# Patient Record
Sex: Male | Born: 1960 | Race: White | Hispanic: No | Marital: Married | Smoking: Current every day smoker
Health system: Southern US, Community
[De-identification: ages and names within clinical notes are randomized; demographics above are authoritative.]

## PROBLEM LIST (undated history)

## (undated) DIAGNOSIS — S129XXA Fracture of neck, unspecified, initial encounter: Secondary | ICD-10-CM

## (undated) DIAGNOSIS — I2510 Atherosclerotic heart disease of native coronary artery without angina pectoris: Secondary | ICD-10-CM

## (undated) DIAGNOSIS — R569 Unspecified convulsions: Secondary | ICD-10-CM

## (undated) DIAGNOSIS — K5792 Diverticulitis of intestine, part unspecified, without perforation or abscess without bleeding: Secondary | ICD-10-CM

## (undated) HISTORY — PX: FRACTURE SURGERY: SHX138

## (undated) HISTORY — PX: OTHER SURGICAL HISTORY: SHX169

## (undated) HISTORY — DX: Atherosclerotic heart disease of native coronary artery without angina pectoris: I25.10

---

## 1998-11-20 ENCOUNTER — Emergency Department (HOSPITAL_COMMUNITY): Admission: EM | Admit: 1998-11-20 | Discharge: 1998-11-20 | Payer: Self-pay | Admitting: Emergency Medicine

## 2010-01-13 ENCOUNTER — Emergency Department (HOSPITAL_COMMUNITY): Admission: EM | Admit: 2010-01-13 | Discharge: 2010-01-13 | Payer: Self-pay | Admitting: Emergency Medicine

## 2010-02-12 ENCOUNTER — Ambulatory Visit (HOSPITAL_COMMUNITY): Admission: RE | Admit: 2010-02-12 | Discharge: 2010-02-12 | Payer: Self-pay | Admitting: Family Medicine

## 2010-03-01 ENCOUNTER — Ambulatory Visit (HOSPITAL_COMMUNITY): Admission: RE | Admit: 2010-03-01 | Payer: Self-pay | Admitting: Family Medicine

## 2010-04-21 ENCOUNTER — Encounter: Payer: Self-pay | Admitting: Family Medicine

## 2010-06-12 LAB — COMPREHENSIVE METABOLIC PANEL
AST: 26 U/L (ref 0–37)
Albumin: 4.1 g/dL (ref 3.5–5.2)
Alkaline Phosphatase: 61 U/L (ref 39–117)
BUN: 8 mg/dL (ref 6–23)
Chloride: 108 mEq/L (ref 96–112)
Creatinine, Ser: 0.93 mg/dL (ref 0.4–1.5)
GFR calc Af Amer: >60 mL/min (ref >60)
Potassium: 3.9 mEq/L (ref 3.5–5.1)
Total Bilirubin: 0.7 mg/dL (ref 0.3–1.2)
Total Protein: 6.6 g/dL (ref 6.0–8.3)

## 2010-06-12 LAB — CBC
HCT: 47 % (ref 39.0–52.0)
Hemoglobin: 16.4 g/dL (ref 13.0–17.0)
MCV: 97.1 fL (ref 78.0–100.0)
RBC: 4.84 MIL/uL (ref 4.22–5.81)
WBC: 4.9 10*3/uL (ref 4.0–10.5)

## 2010-06-12 LAB — GLUCOSE, CAPILLARY: Glucose-Capillary: 106 mg/dL — ABNORMAL HIGH (ref 70–99)

## 2010-06-12 LAB — DIFFERENTIAL
Basophils Absolute: 0 10*3/uL (ref 0.0–0.1)
Eosinophils Relative: 3 % (ref 0–5)
Lymphocytes Relative: 28 % (ref 12–46)
Monocytes Absolute: 0.3 10*3/uL (ref 0.1–1.0)
Monocytes Relative: 5 % (ref 3–12)
Neutro Abs: 3.1 10*3/uL (ref 1.7–7.7)

## 2010-06-12 LAB — CK TOTAL AND CKMB (NOT AT ARMC)
CK, MB: 2.9 ng/mL (ref 0.3–4.0)
Relative Index: 1.8 (ref 0.0–2.5)

## 2010-06-12 LAB — ETHANOL: Alcohol, Ethyl (B): <5 mg/dL (ref 0–10)

## 2010-06-12 LAB — RAPID URINE DRUG SCREEN, HOSP PERFORMED: Barbiturates: NOT DETECTED

## 2010-06-18 ENCOUNTER — Other Ambulatory Visit (HOSPITAL_COMMUNITY): Payer: Self-pay | Admitting: Family Medicine

## 2010-06-18 DIAGNOSIS — M542 Cervicalgia: Secondary | ICD-10-CM

## 2010-06-24 ENCOUNTER — Ambulatory Visit (HOSPITAL_COMMUNITY)
Admission: RE | Admit: 2010-06-24 | Discharge: 2010-06-24 | Disposition: A | Discharge status: Home / Self Care | Payer: BC Managed Care – PPO | Source: Ambulatory Visit | Attending: Family Medicine | Admitting: Family Medicine

## 2010-06-24 DIAGNOSIS — M542 Cervicalgia: Secondary | ICD-10-CM | POA: Insufficient documentation

## 2010-06-24 DIAGNOSIS — M79609 Pain in unspecified limb: Secondary | ICD-10-CM | POA: Insufficient documentation

## 2010-06-24 DIAGNOSIS — M5020 Other cervical disc displacement, unspecified cervical region: Secondary | ICD-10-CM | POA: Insufficient documentation

## 2010-06-27 ENCOUNTER — Other Ambulatory Visit: Payer: Self-pay | Admitting: Family Medicine

## 2010-06-27 DIAGNOSIS — M5000 Cervical disc disorder with myelopathy, unspecified cervical region: Secondary | ICD-10-CM

## 2010-06-28 ENCOUNTER — Ambulatory Visit
Admission: RE | Admit: 2010-06-28 | Discharge: 2010-06-28 | Disposition: A | Discharge status: Home / Self Care | Payer: BC Managed Care – PPO | Source: Ambulatory Visit | Attending: Family Medicine | Admitting: Family Medicine

## 2010-06-28 DIAGNOSIS — M5000 Cervical disc disorder with myelopathy, unspecified cervical region: Secondary | ICD-10-CM

## 2010-10-21 ENCOUNTER — Other Ambulatory Visit (HOSPITAL_COMMUNITY): Payer: Self-pay | Admitting: Family Medicine

## 2010-10-21 ENCOUNTER — Ambulatory Visit (HOSPITAL_COMMUNITY)
Admission: RE | Admit: 2010-10-21 | Discharge: 2010-10-21 | Disposition: A | Discharge status: Home / Self Care | Payer: BC Managed Care – PPO | Source: Ambulatory Visit | Attending: Family Medicine | Admitting: Family Medicine

## 2010-10-21 DIAGNOSIS — M25471 Effusion, right ankle: Secondary | ICD-10-CM

## 2010-10-21 DIAGNOSIS — M25571 Pain in right ankle and joints of right foot: Secondary | ICD-10-CM

## 2010-10-21 DIAGNOSIS — W19XXXA Unspecified fall, initial encounter: Secondary | ICD-10-CM | POA: Insufficient documentation

## 2010-10-21 DIAGNOSIS — M25579 Pain in unspecified ankle and joints of unspecified foot: Secondary | ICD-10-CM | POA: Insufficient documentation

## 2010-10-21 DIAGNOSIS — S8990XA Unspecified injury of unspecified lower leg, initial encounter: Secondary | ICD-10-CM | POA: Insufficient documentation

## 2010-10-21 DIAGNOSIS — M25476 Effusion, unspecified foot: Secondary | ICD-10-CM | POA: Insufficient documentation

## 2010-10-21 DIAGNOSIS — M25473 Effusion, unspecified ankle: Secondary | ICD-10-CM | POA: Insufficient documentation

## 2013-10-15 ENCOUNTER — Encounter (HOSPITAL_COMMUNITY): Payer: Self-pay | Admitting: Emergency Medicine

## 2013-10-15 ENCOUNTER — Emergency Department (HOSPITAL_COMMUNITY)
Admission: EM | Admit: 2013-10-15 | Discharge: 2013-10-15 | Disposition: A | Discharge status: Home / Self Care | Payer: BC Managed Care – PPO | Attending: Emergency Medicine | Admitting: Emergency Medicine

## 2013-10-15 DIAGNOSIS — R42 Dizziness and giddiness: Secondary | ICD-10-CM | POA: Insufficient documentation

## 2013-10-15 DIAGNOSIS — IMO0002 Reserved for concepts with insufficient information to code with codable children: Secondary | ICD-10-CM | POA: Insufficient documentation

## 2013-10-15 DIAGNOSIS — R21 Rash and other nonspecific skin eruption: Secondary | ICD-10-CM | POA: Insufficient documentation

## 2013-10-15 DIAGNOSIS — F17200 Nicotine dependence, unspecified, uncomplicated: Secondary | ICD-10-CM | POA: Diagnosis not present

## 2013-10-15 DIAGNOSIS — T498X5A Adverse effect of other topical agents, initial encounter: Secondary | ICD-10-CM | POA: Insufficient documentation

## 2013-10-15 DIAGNOSIS — T7840XA Allergy, unspecified, initial encounter: Secondary | ICD-10-CM

## 2013-10-15 DIAGNOSIS — Z8669 Personal history of other diseases of the nervous system and sense organs: Secondary | ICD-10-CM | POA: Insufficient documentation

## 2013-10-15 HISTORY — DX: Unspecified convulsions: R56.9

## 2013-10-15 LAB — CBC WITH DIFFERENTIAL/PLATELET
BASOS ABS: 0 10*3/uL (ref 0.0–0.1)
Basophils Relative: 0 % (ref 0–1)
Eosinophils Absolute: 0.1 10*3/uL (ref 0.0–0.7)
Eosinophils Relative: 2 % (ref 0–5)
HEMATOCRIT: 44.9 % (ref 39.0–52.0)
Hemoglobin: 16.1 g/dL (ref 13.0–17.0)
LYMPHS PCT: 22 % (ref 12–46)
Lymphs Abs: 1.6 10*3/uL (ref 0.7–4.0)
MCH: 32.9 pg (ref 26.0–34.0)
MCHC: 35.9 g/dL (ref 30.0–36.0)
MCV: 91.8 fL (ref 78.0–100.0)
Monocytes Absolute: 0.5 10*3/uL (ref 0.1–1.0)
Monocytes Relative: 7 % (ref 3–12)
NEUTROS ABS: 5.3 10*3/uL (ref 1.7–7.7)
NEUTROS PCT: 69 % (ref 43–77)
PLATELETS: 210 10*3/uL (ref 150–400)
RBC: 4.89 MIL/uL (ref 4.22–5.81)
RDW: 13.1 % (ref 11.5–15.5)
WBC: 7.6 10*3/uL (ref 4.0–10.5)

## 2013-10-15 LAB — BASIC METABOLIC PANEL
ANION GAP: 11 (ref 5–15)
BUN: 14 mg/dL (ref 6–23)
CHLORIDE: 102 meq/L (ref 96–112)
CO2: 25 meq/L (ref 19–32)
Calcium: 9.1 mg/dL (ref 8.4–10.5)
Creatinine, Ser: 0.97 mg/dL (ref 0.50–1.35)
GFR calc Af Amer: >90 mL/min (ref >90)
GFR calc non Af Amer: >90 mL/min (ref >90)
Glucose, Bld: 98 mg/dL (ref 70–99)
POTASSIUM: 3.9 meq/L (ref 3.7–5.3)
SODIUM: 138 meq/L (ref 137–147)

## 2013-10-15 MED ORDER — PREDNISONE 20 MG PO TABS: ORAL_TABLET | ORAL | Status: DC

## 2013-10-15 MED ORDER — SODIUM CHLORIDE 0.9 % IV SOLN
Freq: Once | INTRAVENOUS | Status: AC
  Administered 2013-10-15: 21:00:00 via INTRAVENOUS

## 2013-10-15 MED ORDER — DIPHENHYDRAMINE HCL 50 MG/ML IJ SOLN
50.0000 mg | Freq: Once | INTRAMUSCULAR | Status: AC
  Administered 2013-10-15: 50 mg via INTRAVENOUS
  Filled 2013-10-15: qty 1

## 2013-10-15 MED ORDER — METHYLPREDNISOLONE SODIUM SUCC 125 MG IJ SOLR
125.0000 mg | Freq: Once | INTRAMUSCULAR | Status: AC
  Administered 2013-10-15: 125 mg via INTRAVENOUS
  Filled 2013-10-15: qty 2

## 2013-10-15 MED ORDER — FAMOTIDINE IN NACL 20-0.9 MG/50ML-% IV SOLN
20.0000 mg | Freq: Once | INTRAVENOUS | Status: AC
  Administered 2013-10-15: 20 mg via INTRAVENOUS
  Filled 2013-10-15: qty 50

## 2013-10-15 NOTE — ED Provider Notes (Signed)
CSN: [id number]     Arrival date & time 10/15/13  2026 History   First MD Initiated Contact with Patient 10/15/13 2036     No chief complaint on file.    (Consider location/radiation/quality/duration/timing/severity/associated sxs/prior Treatment) HPI Comments: Patient presents to the emergency department for possible allergic reaction. Patient reports that he was at a [employer] and drank a sweet tea and it tasted funny. He was told that there was accidentally a large amount of coffee with the tea. He is concerned about possible caffeine exposure. He is also concerned about other chemical exposures. Patient reports that he had instant onset of flushing and throbbing of his face. He is feeling "pins and needles" all over his body. Exposure was 2 hours ago.   Past Medical History  Diagnosis Date  . Seizures    Past Surgical History  Procedure Laterality Date  . Fracture surgery      neck   History reviewed. No pertinent family history. History  Substance Use Topics  . Smoking status: Current Every Day Smoker  . Smokeless tobacco: Not on file  . Alcohol Use: No    Review of Systems  HENT: Negative for trouble swallowing.   Respiratory: Negative for shortness of breath.   Skin: Positive for rash.  Neurological: Positive for dizziness.      Allergies  Review of patient's allergies indicates no known allergies.  Home Medications   Prior to Admission medications   Medication Sig Start Date End Date Taking? Authorizing Provider  predniSONE (DELTASONE) 20 MG tablet 3 tabs po daily x 3 days, then 2 tabs x 3 days, then 1.5 tabs x 3 days, then 1 tab x 3 days, then 0.5 tabs x 3 days 10/15/13   Gilda Creasehristopher J. Pollina, MD   BP 139/95  Pulse 81  Temp(Src) 98.9 F (37.2 C) (Oral)  Resp 20  Ht 5\' 11"  (1.803 m)  Wt 185 lb (83.915 kg)  BMI 25.81 kg/m2  SpO2 96% Physical Exam  Constitutional: He is oriented to person, place, and time. He appears well-developed and  well-nourished. No distress.  HENT:  Head: Normocephalic and atraumatic.  Right Ear: Hearing normal.  Left Ear: Hearing normal.  Nose: Nose normal.  Mouth/Throat: Oropharynx is clear and moist and mucous membranes are normal.  Eyes: Conjunctivae and EOM are normal. Pupils are equal, round, and reactive to light.  Neck: Normal range of motion. Neck supple.  Cardiovascular: Regular rhythm, S1 normal and S2 normal.  Exam reveals no gallop and no friction rub.   No murmur heard. Pulmonary/Chest: Effort normal and breath sounds normal. No respiratory distress. He exhibits no tenderness.  Abdominal: Soft. Normal appearance and bowel sounds are normal. There is no hepatosplenomegaly. There is no tenderness. There is no rebound, no guarding, no tenderness at McBurney's point and negative Murphy's sign. No hernia.  Musculoskeletal: Normal range of motion.  Neurological: He is alert and oriented to person, place, and time. He has normal strength. No cranial nerve deficit or sensory deficit. Coordination normal. GCS eye subscore is 4. GCS verbal subscore is 5. GCS motor subscore is 6.  Skin: Skin is warm, dry and intact. No rash noted. No cyanosis.  Diffuse erythema of the face, forehead and upper chest.  Psychiatric: He has a normal mood and affect. His speech is normal and behavior is normal. Thought content normal.    ED Course  Procedures (including critical care time) Labs Review Labs Reviewed  CBC WITH DIFFERENTIAL  BASIC METABOLIC PANEL  Imaging Review No results found.   EKG Interpretation None      MDM   Final diagnoses:  Allergic reaction, initial encounter    Patient presents to ER with what appears to be an allergic reaction. Patient has diffuse erythroderma of his face and torso. Patient drank a sweet tea just prior to onset of symptoms. He was told that there might have been significant caffeine contamination in the tea. He was concerned about unknown exposure. His  vital signs have been stable. He was administered Solu-Medrol, Benadryl, Pepcid and monitored. His flushing and redness has improved. Vital signs remained stable. He never had a tonsillectomy throat swelling or difficulty breathing. Patient will be discharged, continue prednisone taper, Benadryl as needed.    Gilda Crease, MD 10/15/13 2259

## 2013-10-15 NOTE — ED Notes (Signed)
Redness to face and neck decreased. Pt. States "I'm feeling better, I am ready to go home". EDP notified.

## 2013-10-15 NOTE — ED Notes (Signed)
[facility]. Pt states that the sweet tea did not taste right. The waitress told pt they had accidentally made a whole pot of coffee and mixed it with the tea. The wife is convinced that the pt drank some kind of chemical. Pts face is red, has a rash on forehead, he feels flushed, and he also feels like pins and needles are in his all over his body.

## 2013-10-15 NOTE — ED Notes (Addendum)
[facility] there was a funny taste to his drink. Pt. Reports restaurant staff told him that concentrated coffee had been accidentally mixed with his drink. Redness noted to patient's face and neck. Pt. Denies itching and pain. Pt. Denies shortness of breath. No acute distress noted.

## 2013-10-15 NOTE — Discharge Instructions (Signed)
Food Allergy °A food allergy occurs from eating something you are sensitive to. Food allergies occur in all age groups. It may be passed to you from your parents (heredity).  °CAUSES  °Some common causes are cow's milk, seafood, eggs, nuts (including peanut butter), wheat, and soybeans. °SYMPTOMS  °Common problems are:  °· Swelling around the mouth. °· An itchy, red rash. °· Hives. °· Vomiting. °· Diarrhea. °Severe allergic reactions are life-threatening. This reaction is called anaphylaxis. It can cause the mouth and throat to swell. This makes it hard to breathe and swallow. In severe reactions, only a small amount of food may be fatal within seconds. °HOME CARE INSTRUCTIONS  °· If you are unsure what caused the reaction, keep a diary of foods eaten and symptoms that followed. Avoid foods that cause reactions. °· If hives or rash are present: °¨ Take medicines as directed. °¨ Use an over-the-counter antihistamine (diphenhydramine) to treat hives and itching as needed. °¨ Apply cold compresses to the skin or take baths in cool water. Avoid hot baths or showers. These will increase the redness and itching. °· If you are severely allergic: °¨ Hospitalization is often required following a severe reaction. °¨ Wear a medical alert bracelet or necklace that describes the allergy. °¨ Carry your anaphylaxis kit or epinephrine injection with you at all times. Both you and your family members should know how to use this. This can be lifesaving if you have a severe reaction. If epinephrine is used, it is important for you to seek immediate medical care or call your local emergency services (911 in [place] When the epinephrine wears off, it can be followed by a delayed reaction, which can be fatal. °· Replace your epinephrine immediately after use in case of another reaction. °· Ask your caregiver for instructions if you have not been taught how to use an epinephrine injection. °· Do not drive until medicines used to treat the  reaction have worn off, unless approved by your caregiver. °SEEK MEDICAL CARE IF:  °· You suspect a food allergy. Symptoms generally happen within 30 minutes of eating a food. °· Your symptoms have not gone away within 2 days. See your caregiver sooner if symptoms are getting worse. °· You develop new symptoms. °· You want to retest yourself with a food or drink you think causes an allergic reaction. Never do this if an anaphylactic reaction to that food or drink has happened before. °· There is a return of the symptoms which brought you to your caregiver. °SEEK IMMEDIATE MEDICAL CARE IF:  °· You have trouble breathing, are wheezing, or you have a tight feeling in your chest or throat. °· You have a swollen mouth, or you have hives, swelling, or itching all over your body. Use your epinephrine injection immediately. This is given into the outside of your thigh, deep into the muscle. Following use of the epinephrine injection, seek help right away. °Seek immediate medical care or call your local emergency services (911 in [place] °MAKE SURE YOU:  °· Understand these instructions. °· Will watch your condition. °· Will get help right away if you are not doing well or get worse. °Document Released: 03/14/2000 Document Revised: 06/09/2011 Document Reviewed: 11/04/2007 °ExitCare® Patient Information ©2015 [employer]. This information is not intended to replace advice given to you by your health care provider. Make sure you discuss any questions you have with your health care provider. ° °

## 2014-08-04 ENCOUNTER — Encounter (HOSPITAL_COMMUNITY): Payer: Self-pay | Admitting: *Deleted

## 2014-08-04 ENCOUNTER — Emergency Department (HOSPITAL_COMMUNITY): Payer: BLUE CROSS/BLUE SHIELD

## 2014-08-04 ENCOUNTER — Inpatient Hospital Stay (HOSPITAL_COMMUNITY)
Admission: EM | Admit: 2014-08-04 | Discharge: 2014-08-10 | DRG: 330 | Disposition: A | Discharge status: Home Health | Payer: BLUE CROSS/BLUE SHIELD | Attending: Surgery | Admitting: Surgery

## 2014-08-04 ENCOUNTER — Inpatient Hospital Stay (HOSPITAL_COMMUNITY): Payer: BLUE CROSS/BLUE SHIELD

## 2014-08-04 ENCOUNTER — Inpatient Hospital Stay (HOSPITAL_COMMUNITY): Payer: BLUE CROSS/BLUE SHIELD | Admitting: Anesthesiology

## 2014-08-04 ENCOUNTER — Encounter (HOSPITAL_COMMUNITY): Admission: EM | Disposition: A | Discharge status: Home Health | Payer: Self-pay | Source: Home / Self Care

## 2014-08-04 DIAGNOSIS — F17210 Nicotine dependence, cigarettes, uncomplicated: Secondary | ICD-10-CM | POA: Diagnosis present

## 2014-08-04 DIAGNOSIS — R0902 Hypoxemia: Secondary | ICD-10-CM

## 2014-08-04 DIAGNOSIS — R109 Unspecified abdominal pain: Secondary | ICD-10-CM | POA: Diagnosis present

## 2014-08-04 DIAGNOSIS — K8020 Calculus of gallbladder without cholecystitis without obstruction: Secondary | ICD-10-CM | POA: Diagnosis present

## 2014-08-04 DIAGNOSIS — K631 Perforation of intestine (nontraumatic): Secondary | ICD-10-CM | POA: Diagnosis present

## 2014-08-04 DIAGNOSIS — K5720 Diverticulitis of large intestine with perforation and abscess without bleeding: Principal | ICD-10-CM | POA: Diagnosis present

## 2014-08-04 DIAGNOSIS — E8770 Fluid overload, unspecified: Secondary | ICD-10-CM | POA: Diagnosis not present

## 2014-08-04 DIAGNOSIS — Z452 Encounter for adjustment and management of vascular access device: Secondary | ICD-10-CM

## 2014-08-04 HISTORY — PX: LAPAROTOMY: SHX154

## 2014-08-04 LAB — I-STAT CHEM 8, ED
BUN: 16 mg/dL (ref 6–20)
CHLORIDE: 99 mmol/L — AB (ref 101–111)
CREATININE: 0.9 mg/dL (ref 0.61–1.24)
Calcium, Ion: 1.12 mmol/L (ref 1.12–1.23)
Glucose, Bld: 182 mg/dL — ABNORMAL HIGH (ref 70–99)
HCT: 48 % (ref 39.0–52.0)
Hemoglobin: 16.3 g/dL (ref 13.0–17.0)
Potassium: 3.5 mmol/L (ref 3.5–5.1)
SODIUM: 140 mmol/L (ref 135–145)
TCO2: 22 mmol/L (ref 0–100)

## 2014-08-04 LAB — CBC WITH DIFFERENTIAL/PLATELET
Basophils Absolute: 0 10*3/uL (ref 0.0–0.1)
Basophils Relative: 0 % (ref 0–1)
EOS PCT: 0 % (ref 0–5)
Eosinophils Absolute: 0 10*3/uL (ref 0.0–0.7)
HEMATOCRIT: 44.7 % (ref 39.0–52.0)
HEMOGLOBIN: 15.9 g/dL (ref 13.0–17.0)
LYMPHS ABS: 1 10*3/uL (ref 0.7–4.0)
LYMPHS PCT: 8 % — AB (ref 12–46)
MCH: 33.1 pg (ref 26.0–34.0)
MCHC: 35.6 g/dL (ref 30.0–36.0)
MCV: 93.1 fL (ref 78.0–100.0)
MONO ABS: 0.6 10*3/uL (ref 0.1–1.0)
Monocytes Relative: 5 % (ref 3–12)
Neutro Abs: 10.4 10*3/uL — ABNORMAL HIGH (ref 1.7–7.7)
Neutrophils Relative %: 87 % — ABNORMAL HIGH (ref 43–77)
Platelets: 251 10*3/uL (ref 150–400)
RBC: 4.8 MIL/uL (ref 4.22–5.81)
RDW: 12.8 % (ref 11.5–15.5)
WBC: 11.9 10*3/uL — AB (ref 4.0–10.5)

## 2014-08-04 LAB — HEPATIC FUNCTION PANEL
ALT: 23 U/L (ref 17–63)
AST: 19 U/L (ref 15–41)
Albumin: 4 g/dL (ref 3.5–5.0)
Alkaline Phosphatase: 68 U/L (ref 38–126)
BILIRUBIN INDIRECT: 0.5 mg/dL (ref 0.3–0.9)
BILIRUBIN TOTAL: 0.7 mg/dL (ref 0.3–1.2)
Bilirubin, Direct: 0.2 mg/dL (ref 0.1–0.5)
TOTAL PROTEIN: 6.9 g/dL (ref 6.5–8.1)

## 2014-08-04 LAB — I-STAT CG4 LACTIC ACID, ED
Lactic Acid, Venous: 2.06 mmol/L (ref 0.5–2.0)
Lactic Acid, Venous: 4.07 mmol/L (ref 0.5–2.0)

## 2014-08-04 LAB — GLUCOSE, CAPILLARY: Glucose-Capillary: 83 mg/dL (ref 70–99)

## 2014-08-04 SURGERY — LAPAROTOMY, EXPLORATORY
Anesthesia: General | Site: Abdomen

## 2014-08-04 MED ORDER — HYDROMORPHONE HCL 1 MG/ML IJ SOLN
INTRAMUSCULAR | Status: AC
  Filled 2014-08-04: qty 1

## 2014-08-04 MED ORDER — SODIUM CHLORIDE 0.9 % IR SOLN
Status: DC | PRN
  Administered 2014-08-04: 9000 mL

## 2014-08-04 MED ORDER — GLYCOPYRROLATE 0.2 MG/ML IJ SOLN
INTRAMUSCULAR | Status: AC
  Filled 2014-08-04: qty 3

## 2014-08-04 MED ORDER — GLYCOPYRROLATE 0.2 MG/ML IJ SOLN
INTRAMUSCULAR | Status: DC | PRN
  Administered 2014-08-04: 0.6 mg via INTRAVENOUS

## 2014-08-04 MED ORDER — SODIUM CHLORIDE 0.9 % IJ SOLN
INTRAMUSCULAR | Status: AC
  Filled 2014-08-04: qty 20

## 2014-08-04 MED ORDER — ONDANSETRON HCL 4 MG/2ML IJ SOLN
INTRAMUSCULAR | Status: DC | PRN
  Administered 2014-08-04: 4 mg via INTRAVENOUS

## 2014-08-04 MED ORDER — ACETAMINOPHEN 500 MG PO TABS
1000.0000 mg | ORAL_TABLET | Freq: Four times a day (QID) | ORAL | Status: AC
  Administered 2014-08-05 (×2): 1000 mg via ORAL
  Filled 2014-08-04 (×2): qty 2

## 2014-08-04 MED ORDER — SODIUM CHLORIDE 0.9 % IJ SOLN
INTRAMUSCULAR | Status: AC
  Filled 2014-08-04: qty 10

## 2014-08-04 MED ORDER — SODIUM CHLORIDE 0.9 % IV SOLN
1000.0000 mL | Freq: Once | INTRAVENOUS | Status: AC
  Administered 2014-08-04: 1000 mL via INTRAVENOUS

## 2014-08-04 MED ORDER — PIPERACILLIN-TAZOBACTAM 3.375 G IVPB
3.3750 g | Freq: Three times a day (TID) | INTRAVENOUS | Status: DC
  Administered 2014-08-04 – 2014-08-10 (×18): 3.375 g via INTRAVENOUS
  Filled 2014-08-04 (×19): qty 50

## 2014-08-04 MED ORDER — MORPHINE SULFATE 4 MG/ML IJ SOLN
4.0000 mg | Freq: Once | INTRAMUSCULAR | Status: AC
Start: 2014-08-04 — End: 2014-08-04
  Administered 2014-08-04: 4 mg via INTRAVENOUS
  Filled 2014-08-04: qty 1

## 2014-08-04 MED ORDER — ONDANSETRON HCL 4 MG PO TABS: 4.0000 mg | ORAL_TABLET | Freq: Four times a day (QID) | ORAL | Status: DC | PRN

## 2014-08-04 MED ORDER — SODIUM CHLORIDE 0.9 % IV BOLUS (SEPSIS)
2000.0000 mL | Freq: Once | INTRAVENOUS | Status: AC
  Administered 2014-08-04: 2000 mL via INTRAVENOUS

## 2014-08-04 MED ORDER — MORPHINE SULFATE (PF) 1 MG/ML IV SOLN
INTRAVENOUS | Status: AC
  Filled 2014-08-04: qty 25

## 2014-08-04 MED ORDER — ROCURONIUM BROMIDE 100 MG/10ML IV SOLN
INTRAVENOUS | Status: DC | PRN
Start: 2014-08-04 — End: 2014-08-04
  Administered 2014-08-04 (×3): 10 mg via INTRAVENOUS
  Administered 2014-08-04: 40 mg via INTRAVENOUS

## 2014-08-04 MED ORDER — MORPHINE SULFATE 4 MG/ML IJ SOLN
4.0000 mg | Freq: Once | INTRAMUSCULAR | Status: AC
  Administered 2014-08-04: 4 mg via INTRAVENOUS
  Filled 2014-08-04: qty 1

## 2014-08-04 MED ORDER — SUCCINYLCHOLINE CHLORIDE 20 MG/ML IJ SOLN
INTRAMUSCULAR | Status: DC | PRN
  Administered 2014-08-04: 100 mg via INTRAVENOUS

## 2014-08-04 MED ORDER — NEOSTIGMINE METHYLSULFATE 10 MG/10ML IV SOLN
INTRAVENOUS | Status: AC
  Filled 2014-08-04: qty 1

## 2014-08-04 MED ORDER — HEPARIN SODIUM (PORCINE) 5000 UNIT/ML IJ SOLN
5000.0000 [IU] | Freq: Three times a day (TID) | INTRAMUSCULAR | Status: DC
  Administered 2014-08-04 – 2014-08-10 (×16): 5000 [IU] via SUBCUTANEOUS
  Filled 2014-08-04 (×19): qty 1

## 2014-08-04 MED ORDER — SODIUM CHLORIDE 0.9 % IV SOLN
1000.0000 mL | INTRAVENOUS | Status: DC
  Administered 2014-08-04: 05:00:00 via INTRAVENOUS

## 2014-08-04 MED ORDER — ONDANSETRON HCL 4 MG/2ML IJ SOLN
4.0000 mg | Freq: Once | INTRAMUSCULAR | Status: AC
  Administered 2014-08-04: 4 mg via INTRAVENOUS
  Filled 2014-08-04: qty 2

## 2014-08-04 MED ORDER — IOHEXOL 300 MG/ML  SOLN
100.0000 mL | Freq: Once | INTRAMUSCULAR | Status: AC | PRN
  Administered 2014-08-04: 100 mL via INTRAVENOUS

## 2014-08-04 MED ORDER — DIPHENHYDRAMINE HCL 50 MG/ML IJ SOLN: 12.5000 mg | Freq: Four times a day (QID) | INTRAMUSCULAR | Status: DC | PRN

## 2014-08-04 MED ORDER — LIP MEDEX EX OINT
TOPICAL_OINTMENT | CUTANEOUS | Status: AC
  Administered 2014-08-04: 21:00:00
  Filled 2014-08-04: qty 7

## 2014-08-04 MED ORDER — ONDANSETRON HCL 4 MG/2ML IJ SOLN
INTRAMUSCULAR | Status: AC
  Filled 2014-08-04: qty 2

## 2014-08-04 MED ORDER — NALOXONE HCL 0.4 MG/ML IJ SOLN: 0.4000 mg | INTRAMUSCULAR | Status: DC | PRN

## 2014-08-04 MED ORDER — EPHEDRINE SULFATE 50 MG/ML IJ SOLN
INTRAMUSCULAR | Status: AC
  Filled 2014-08-04: qty 1

## 2014-08-04 MED ORDER — NEOSTIGMINE METHYLSULFATE 10 MG/10ML IV SOLN
INTRAVENOUS | Status: DC | PRN
  Administered 2014-08-04: 4 mg via INTRAVENOUS

## 2014-08-04 MED ORDER — SODIUM CHLORIDE 0.9 % IV SOLN
Freq: Once | INTRAVENOUS | Status: AC
  Administered 2014-08-04: 02:00:00 via INTRAVENOUS

## 2014-08-04 MED ORDER — FENTANYL CITRATE (PF) 250 MCG/5ML IJ SOLN
INTRAMUSCULAR | Status: AC
  Filled 2014-08-04: qty 5

## 2014-08-04 MED ORDER — PROPOFOL 10 MG/ML IV BOLUS
INTRAVENOUS | Status: AC
  Filled 2014-08-04: qty 20

## 2014-08-04 MED ORDER — LIDOCAINE HCL (CARDIAC) 20 MG/ML IV SOLN
INTRAVENOUS | Status: DC | PRN
Start: 2014-08-04 — End: 2014-08-04
  Administered 2014-08-04: 100 mg via INTRAVENOUS

## 2014-08-04 MED ORDER — ONDANSETRON HCL 4 MG/2ML IJ SOLN: 4.0000 mg | Freq: Four times a day (QID) | INTRAMUSCULAR | Status: DC | PRN

## 2014-08-04 MED ORDER — DIPHENHYDRAMINE HCL 12.5 MG/5ML PO ELIX
12.5000 mg | ORAL_SOLUTION | Freq: Four times a day (QID) | ORAL | Status: DC | PRN
  Filled 2014-08-04: qty 5

## 2014-08-04 MED ORDER — HYDROMORPHONE HCL 1 MG/ML IJ SOLN
1.0000 mg | Freq: Once | INTRAMUSCULAR | Status: AC
  Administered 2014-08-04: 1 mg via INTRAVENOUS
  Filled 2014-08-04: qty 1

## 2014-08-04 MED ORDER — SODIUM CHLORIDE 0.9 % IJ SOLN: 9.0000 mL | INTRAMUSCULAR | Status: DC | PRN

## 2014-08-04 MED ORDER — HYDROMORPHONE HCL 2 MG/ML IJ SOLN
INTRAMUSCULAR | Status: AC
  Filled 2014-08-04: qty 1

## 2014-08-04 MED ORDER — POTASSIUM CHLORIDE IN NACL 20-0.45 MEQ/L-% IV SOLN
INTRAVENOUS | Status: DC
  Administered 2014-08-04 (×2): via INTRAVENOUS
  Administered 2014-08-06: 50 mL/h via INTRAVENOUS
  Administered 2014-08-07: 10:00:00 via INTRAVENOUS
  Administered 2014-08-08: 50 mL/h via INTRAVENOUS
  Administered 2014-08-09: 03:00:00 via INTRAVENOUS
  Filled 2014-08-04 (×10): qty 1000

## 2014-08-04 MED ORDER — LIDOCAINE HCL (CARDIAC) 20 MG/ML IV SOLN
INTRAVENOUS | Status: AC
  Filled 2014-08-04: qty 5

## 2014-08-04 MED ORDER — HYDROMORPHONE HCL 1 MG/ML IJ SOLN
INTRAMUSCULAR | Status: DC | PRN
  Administered 2014-08-04 (×2): 1 mg via INTRAVENOUS

## 2014-08-04 MED ORDER — PROPOFOL 10 MG/ML IV BOLUS
INTRAVENOUS | Status: DC | PRN
  Administered 2014-08-04: 150 mg via INTRAVENOUS
  Administered 2014-08-04: 20 mg via INTRAVENOUS

## 2014-08-04 MED ORDER — ROCURONIUM BROMIDE 100 MG/10ML IV SOLN
INTRAVENOUS | Status: AC
  Filled 2014-08-04: qty 1

## 2014-08-04 MED ORDER — IOHEXOL 300 MG/ML  SOLN
50.0000 mL | Freq: Once | INTRAMUSCULAR | Status: AC | PRN
  Administered 2014-08-04: 50 mL via ORAL

## 2014-08-04 MED ORDER — MORPHINE SULFATE (PF) 1 MG/ML IV SOLN
INTRAVENOUS | Status: DC
  Administered 2014-08-04: 12 mg via INTRAVENOUS
  Administered 2014-08-04 (×2): via INTRAVENOUS
  Administered 2014-08-04: 30 mg via INTRAVENOUS
  Administered 2014-08-04: 25 mg via INTRAVENOUS
  Administered 2014-08-05: 9 mg via INTRAVENOUS
  Administered 2014-08-05: 16.5 mg via INTRAVENOUS
  Administered 2014-08-05: 3 mg via INTRAVENOUS
  Administered 2014-08-05: 16.5 mg via INTRAVENOUS
  Administered 2014-08-05: 25 mg via INTRAVENOUS
  Administered 2014-08-05: 1.5 mg via INTRAVENOUS
  Administered 2014-08-06: 6 mg via INTRAVENOUS
  Administered 2014-08-06: 7 mg via INTRAVENOUS
  Administered 2014-08-06: 4.5 mg via INTRAVENOUS
  Administered 2014-08-06: 3 mg via INTRAVENOUS
  Administered 2014-08-06: 9 mg via INTRAVENOUS
  Administered 2014-08-07: 4.5 mg via INTRAVENOUS
  Administered 2014-08-07: 3 mg via INTRAVENOUS
  Administered 2014-08-07: 11:00:00 via INTRAVENOUS
  Administered 2014-08-07: 3 mg via INTRAVENOUS
  Administered 2014-08-07: 7.5 mg via INTRAVENOUS
  Administered 2014-08-07 (×2): 3 mg via INTRAVENOUS
  Administered 2014-08-08: 0 mg via INTRAVENOUS
  Administered 2014-08-08: 1.5 mg via INTRAVENOUS
  Filled 2014-08-04 (×6): qty 25

## 2014-08-04 MED ORDER — LACTATED RINGERS IV SOLN
INTRAVENOUS | Status: DC | PRN
  Administered 2014-08-04 (×5): via INTRAVENOUS

## 2014-08-04 MED ORDER — FENTANYL CITRATE (PF) 100 MCG/2ML IJ SOLN
INTRAMUSCULAR | Status: DC | PRN
  Administered 2014-08-04 (×5): 50 ug via INTRAVENOUS

## 2014-08-04 MED ORDER — HYDROMORPHONE HCL 1 MG/ML IJ SOLN
0.2500 mg | INTRAMUSCULAR | Status: DC | PRN
  Administered 2014-08-04 (×4): 0.5 mg via INTRAVENOUS

## 2014-08-04 MED ORDER — PIPERACILLIN-TAZOBACTAM 3.375 G IVPB
3.3750 g | Freq: Once | INTRAVENOUS | Status: AC
  Administered 2014-08-04: 3.375 g via INTRAVENOUS
  Filled 2014-08-04: qty 50

## 2014-08-04 SURGICAL SUPPLY — 50 items
APPLICATOR COTTON TIP 6IN STRL (MISCELLANEOUS) IMPLANT
BLADE EXTENDED COATED 6.5IN (ELECTRODE) ×3 IMPLANT
BLADE HEX COATED 2.75 (ELECTRODE) ×3 IMPLANT
BNDG GAUZE ELAST 4 BULKY (GAUZE/BANDAGES/DRESSINGS) ×3 IMPLANT
CLAMP POUCH DRAINAGE QUIET (OSTOMY) ×3 IMPLANT
COVER MAYO STAND STRL (DRAPES) IMPLANT
DRAPE LAPAROSCOPIC ABDOMINAL (DRAPES) ×3 IMPLANT
DRAPE WARM FLUID 44X44 (DRAPE) ×3 IMPLANT
ELECT REM PT RETURN 9FT ADLT (ELECTROSURGICAL) ×3
ELECTRODE REM PT RTRN 9FT ADLT (ELECTROSURGICAL) ×1 IMPLANT
GAUZE SPONGE 4X4 12PLY STRL (GAUZE/BANDAGES/DRESSINGS) ×3 IMPLANT
GLOVE BIOGEL PI IND STRL 7.0 (GLOVE) ×1 IMPLANT
GLOVE BIOGEL PI INDICATOR 7.0 (GLOVE) ×2
GLOVE SURG SIGNA 7.5 PF LTX (GLOVE) ×3 IMPLANT
GOWN SPEC L4 XLG W/TWL (GOWN DISPOSABLE) ×3 IMPLANT
GOWN STRL REUS W/ TWL XL LVL3 (GOWN DISPOSABLE) ×3 IMPLANT
GOWN STRL REUS W/TWL LRG LVL3 (GOWN DISPOSABLE) ×3 IMPLANT
GOWN STRL REUS W/TWL XL LVL3 (GOWN DISPOSABLE) ×6
KIT BASIN OR (CUSTOM PROCEDURE TRAY) ×3 IMPLANT
LIGASURE IMPACT 36 18CM CVD LR (INSTRUMENTS) ×3 IMPLANT
NS IRRIG 1000ML POUR BTL (IV SOLUTION) ×6 IMPLANT
PACK GENERAL/GYN (CUSTOM PROCEDURE TRAY) ×3 IMPLANT
PAD ABD 8X10 STRL (GAUZE/BANDAGES/DRESSINGS) ×3 IMPLANT
POUCH OSTOMY 2  H (OSTOMY) ×3 IMPLANT
SPONGE LAP 18X18 X RAY DECT (DISPOSABLE) ×9 IMPLANT
STAPLER CUT CVD 40MM GREEN (STAPLE) ×3 IMPLANT
STAPLER CUT RELOAD GREEN (STAPLE) ×3 IMPLANT
STAPLER VISISTAT 35W (STAPLE) ×3 IMPLANT
SUCTION POOLE TIP (SUCTIONS) ×3 IMPLANT
SUT NOVA 1 T20/GS 25DT (SUTURE) ×6 IMPLANT
SUT PDS AB 1 CTX 36 (SUTURE) IMPLANT
SUT PROLENE 2 0 SH DA (SUTURE) ×3 IMPLANT
SUT SILK 2 0 (SUTURE) ×4
SUT SILK 2 0 SH CR/8 (SUTURE) ×3 IMPLANT
SUT SILK 2 0SH CR/8 30 (SUTURE) ×3 IMPLANT
SUT SILK 2-0 18XBRD TIE 12 (SUTURE) ×2 IMPLANT
SUT SILK 3 0 (SUTURE) ×2
SUT SILK 3 0 SH CR/8 (SUTURE) ×3 IMPLANT
SUT SILK 3-0 18XBRD TIE 12 (SUTURE) ×1 IMPLANT
SUT VIC AB 2-0 SH 18 (SUTURE) ×3 IMPLANT
SUT VIC AB 3-0 SH 8-18 (SUTURE) ×6 IMPLANT
SUT VICRYL 2 0 18  UND BR (SUTURE) ×2
SUT VICRYL 2 0 18 UND BR (SUTURE) ×1 IMPLANT
SWAB COLLECTION DEVICE MRSA (MISCELLANEOUS) ×3 IMPLANT
TAPE CLOTH SURG 6X10 WHT LF (GAUZE/BANDAGES/DRESSINGS) ×3 IMPLANT
TOWEL OR 17X26 10 PK STRL BLUE (TOWEL DISPOSABLE) ×6 IMPLANT
TRAY FOLEY W/METER SILVER 14FR (SET/KITS/TRAYS/PACK) ×3 IMPLANT
TUBE ANAEROBIC SPECIMEN COL (MISCELLANEOUS) ×3 IMPLANT
WATER STERILE IRR 1500ML POUR (IV SOLUTION) ×3 IMPLANT
YANKAUER SUCT BULB TIP NO VENT (SUCTIONS) ×3 IMPLANT

## 2014-08-04 NOTE — Progress Notes (Signed)
Date:  Aug 04, 2014 U.R. performed for needs and level of care. Will continue to follow for Case Management needs.  Tinlee Navarrette, RN, BSN, CCM   [phone] 

## 2014-08-04 NOTE — ED Notes (Signed)
Charge RN, Camelia Engerri called wife and made her aware pt was here in our ED and updated her on status of pt, pt okayed wife being called and provided number.

## 2014-08-04 NOTE — Op Note (Signed)
[place]:  Larson, Travis                   ACCOUNT NO.:  [id number]  MEDICAL RECORD NO.:  [id number]  LOCATION:  [facility]                         [facility]  PHYSICIAN:  Travis Larson, M.D.  DATE OF BIRTH:  01/19/1961  DATE OF PROCEDURE:  08/04/2014                              OPERATIVE REPORT   PREOPERATIVE DIAGNOSIS:  Probable perforated sigmoid colon, secondary to diverticulitis.  POSTOPERATIVE DIAGNOSIS:  Perforated sigmoid colon, secondary to diverticulitis.  Extensive intraabdominal contamination.  PROCEDURE:  Exploratory laparotomy with sigmoid colectomy, and left end colon colostomy, Hartmann's pouch.  SURGEON:  Travis Larson, M.D.  FIRST ASSISTANT:  F. Donell BeersByerly, M. D. .  ANESTHESIA:  General endotracheal.  ESTIMATED BLOOD LOSS:  300 mL.  DRAINS:  Left in were none.  SPECIMEN:  With the sigmoid colon with a suture marking proximal.  INDICATION FOR PROCEDURE:  Travis Larson is a 54 year old white male, who [facility] ambulance with acute abdominal pain.  A CT scan revealed what appears to be a perforated sigmoid colon diverticulitis and pnuemoperitoneum.  The patient had a rigid abdomen, had a lactic acid which went to 2 to 4 in about an hour, and I think is best served by exploratory laparotomy for control of this infection.  I discussed the indications and potential complications with the patient and his wife.  I spoke to his wife by phone.   The risks of surgery include bleeding, infection, which he already has, the need of a colostomy, and the risk of postoperative complications.  OPERATIVE NOTE:  The patient was [facility].  He was already on Zosyn as antibiotic.  Dr. Benjamin StainBruce Dennenny was the anesthesiologist.  He underwent general anesthesia.  Foley catheter was placed and NG tube placed.    A time-out was held and surgical checklist run.  I went through a midline incision.  I entered the abdominal cavity.  He  had gross purulence throughout the abdominal cavity, and I did cultures for both aerobes and anaerobes.  His right and left lobes of liver were unremarkable.  He has gallstones by CT scan, but I could not feel these on exploration.  His NG tube was in good position, though was not decompressing the stomach.  I think because he had residual food in the stomach.  The small bowel was run from the ligament of Tritz to the terminal ileum and was unremarkable.  There was a lot of contaminated peritoneal fluid between bowel loops.  His right colon, transverse and left colon were unremarkable.  In his pelvis, he had a perforation approximately in mid sigmoid colon near the pelvic brim.  He had these very large appendices epiploica.  He has a short fat mesentery of the colon, which made mobilizing the colon somewhat difficult.   His abdominal wall is fairly thin, but he had typical male central adiposity.    I identified the perforation approximately 10-12 cm above the peritoneal reflection in the mid sigmoid colon.  I used a green load of the Contour stapler and divided the distal colon.  I placed two 2-0 Prolenes in the  distal colon stump.  Because his mesentery was short , the colon was hard to expose.  I tried to stay out of his retroperitoneum, and I did not try to identify the ureter.  There was no evidence of malignancy so I tended to keep my dissection near the colon.  I mobilized the sigmoid colon.  I had to put some sutures in the mesentery for bleeding.  I mobilized his left colon up to about 3/4's to the splenic flexure so I could bring the divided colon to the abdominal wall under no tension.  I divided the proximal sigmoid colon with a Blue load of the Contour stapler.  I marked the proximal end of the resected bowel with a suture.  I was able to bring the divided colon up to the left of the abdominal wall.  I then irrigated the abdomen with 7 L of saline until everything was clear.  I then brought the  colostomy out through a 2.5 cm circular incision in the skin on the left abdominal wall.  I went through a muscle splitting incision in the rectus abdominis muscle.   I then tried to bring the omentum down to cover the bowel under the midline incision.  I closed the abdomen with 2 running #1 Novafil sutures with interrupted 1-0 Novafil about every 3rd or 4th throw.  I left the wound packed open with saline gauze.  I then matured the colostomy in the left upper quadrant with 3-0 Vicryl sutures.  Of note, because of the appendices epiploica and the fat around his bowel, I tried not to skeletonize the colon too much.   I thought the mucosa looked pink at the time of maturing the colostomy and I placed a colostomy bag on the ostomy.  The patient tolerated the procedure well.  His vital signs were much better with fluid resuscitation at the end of the case than before the case, and he will be sent to the ICU for management postoperatively.  Dr. Gentry RochJudd is now the Anesthesiologist.  She will place a central line. Sponge and needle counts were correct.    Travis Larson, M.D., [facility], scribe for Epic   DHN/MEDQ  D:  08/04/2014  T:  08/04/2014  Job:  [id number]

## 2014-08-04 NOTE — ED Provider Notes (Signed)
CSN: [id number]     Arrival date & time 08/04/14  0116 History   First MD Initiated Contact with Patient 08/04/14 0121     Chief Complaint  Patient presents with  . Abdominal Pain     (Consider location/radiation/quality/duration/timing/severity/associated sxs/prior Treatment) HPI Comments: States he went to urgent care today because he was unable to urinate.  They gave him Flomax and he took a friend's OxyContin for pain.  He called the ambulance because of increased abdominal pain, bloating, and inability to urinate again.  It's been approximately 4 hours since his last urination. Denies any abdominal trauma, previous history of abdominal surgeries, nausea, vomiting, diarrhea  Patient is a 54 y.o. male presenting with abdominal pain. The history is provided by the patient.  Abdominal Pain Pain location:  Generalized Pain quality: aching and bloating   Pain severity:  Moderate Onset quality:  Gradual Duration:  1 day Timing:  Constant Progression:  Worsening Chronicity:  New Relieved by:  None tried Worsened by:  Nothing tried Ineffective treatments:  None tried Associated symptoms: no chest pain, no chills, no constipation, no diarrhea, no fever, no nausea, no shortness of breath and no vomiting     Past Medical History  Diagnosis Date  . Seizures    Past Surgical History  Procedure Laterality Date  . Fracture surgery      neck   No family history on file. History  Substance Use Topics  . Smoking status: Current Every Day Smoker  . Smokeless tobacco: Not on file  . Alcohol Use: No    Review of Systems  Constitutional: Negative for fever and chills.  Respiratory: Negative for shortness of breath.   Cardiovascular: Negative for chest pain and leg swelling.  Gastrointestinal: Positive for abdominal pain and abdominal distention. Negative for nausea, vomiting, diarrhea and constipation.  Genitourinary: Positive for decreased urine volume.  Skin: Negative for pallor.   All other systems reviewed and are negative.     Allergies  Review of patient's allergies indicates no known allergies.  Home Medications   Prior to Admission medications   Medication Sig Start Date End Date Taking? Authorizing Provider  tamsulosin (FLOMAX) 0.4 MG CAPS capsule Take 1 capsule by mouth daily. 07/31/14  Yes Historical Provider, MD  predniSONE (DELTASONE) 20 MG tablet 3 tabs po daily x 3 days, then 2 tabs x 3 days, then 1.5 tabs x 3 days, then 1 tab x 3 days, then 0.5 tabs x 3 days Patient not taking: Reported on 08/04/2014 10/15/13   Gilda Creasehristopher J Pollina, MD   BP 131/89 mmHg  Pulse 122  Temp(Src) 97.3 F (36.3 C) (Oral)  Resp 32  SpO2 94% Physical Exam  Constitutional: He is oriented to person, place, and time. He appears well-developed and well-nourished.  Patient moaning   HENT:  Head: Normocephalic.  Mouth/Throat: Oropharynx is clear and moist.  Eyes: Pupils are equal, round, and reactive to light.  Neck: Normal range of motion.  Cardiovascular: Regular rhythm.  Tachycardia present.   Pulmonary/Chest: Effort normal and breath sounds normal.  Abdominal: He exhibits distension. There is generalized tenderness. There is no rigidity, no rebound and no guarding.  Musculoskeletal: Normal range of motion.  Neurological: He is alert and oriented to person, place, and time.  Skin: Skin is warm and dry. No erythema. There is pallor.  Vitals reviewed.   ED Course  Procedures (including critical care time) Labs Review Labs Reviewed  CBC WITH DIFFERENTIAL/PLATELET - Abnormal; Notable for the following:  WBC 11.9 (*)    Neutrophils Relative % 87 (*)    Neutro Abs 10.4 (*)    Lymphocytes Relative 8 (*)    All other components within normal limits  I-STAT CHEM 8, ED - Abnormal; Notable for the following:    Chloride 99 (*)    Glucose, Bld 182 (*)    All other components within normal limits  I-STAT CG4 LACTIC ACID, ED - Abnormal; Notable for the following:     Lactic Acid, Venous 2.06 (*)    All other components within normal limits  I-STAT CG4 LACTIC ACID, ED - Abnormal; Notable for the following:    Lactic Acid, Venous 4.07 (*)    All other components within normal limits  HEPATIC FUNCTION PANEL  URINALYSIS, ROUTINE W REFLEX MICROSCOPIC    Imaging Review Ct Abdomen Pelvis W Contrast  08/04/2014   CLINICAL DATA:  Severe lower abdominal pain  EXAM: CT ABDOMEN AND PELVIS WITH CONTRAST  TECHNIQUE: Multidetector CT imaging of the abdomen and pelvis was performed using the standard protocol following bolus administration of intravenous contrast.  CONTRAST:  50mL OMNIPAQUE IOHEXOL 300 MG/ML SOLN, 100mL OMNIPAQUE IOHEXOL 300 MG/ML SOLN  COMPARISON:  None.  FINDINGS: There is acute inflammatory change surrounding a diverticulum of the mid sigmoid colon. There are widely scattered bubbles of extraluminal air around the involved portion of the sigmoid and also extending more widely throughout the peritoneum. There is no defined abscess. There is a small volume free fluid in the dependent pelvis and there also is a small volume fluid around the liver and around the spleen.  There are normal appearances of the liver, spleen, pancreas, adrenals and kidneys.  Multiple calculi are present in the gallbladder, measuring up to 11 mm. There is no bile duct dilatation.  The abdominal aorta is normal in caliber with mild atherosclerotic calcification.  There is no significant abnormality in the lower chest.  IMPRESSION: *Acute diverticulitis of the mid sigmoid colon with perforation and widespread peritoneal contamination. There is small volume free air scattered throughout the peritoneum. *Perihepatic and perisplenic fluid, small volume. There also is a small volume fluid collected dependently in the pelvis. *Cholelithiasis   Electronically Signed   By: Ellery Plunkaniel R Mitchell M.D.   On: 08/04/2014 03:26   Dg Abd Acute W/chest  08/04/2014   CLINICAL DATA:  Increasing inguinal pain for  the past 2 days. Nausea for  EXAM: DG ABDOMEN ACUTE W/ 1V CHEST  COMPARISON:  None.  FINDINGS: There are slightly prominent small bowel loops in the left mid abdomen with a suggestion of thumbprinting. This suggests mural edema. Air is scattered throughout the colon to the rectum. There is no free intraperitoneal air. No urinary or biliary calculi are evident.  IMPRESSION: Mildly prominent mid abdominal small bowel loops with a suggestion of mural edema. This is more likely nonobstructive.   Electronically Signed   By: Ellery Plunkaniel R Mitchell M.D.   On: 08/04/2014 02:02     EKG Interpretation None     Abdominal examination has changed now abdomen rigid and tympanic, mottled.  [facility] the patient has a ruptured diverticulum in the mid sigmoid with free air and a small amount of ascites Dr. Clemetine MarkerNiemann to bedside.  Will take patient to operating room MDM   Final diagnoses:  Abdominal pain  Perforated diverticulum of large intestine         Earley FavorGail Nalini Alcaraz, NP 08/04/14 0436  Earley FavorGail Prabhjot Maddux, NP 08/04/14 [phone]  Marisa Severinlga Otter, MD 08/04/14  0527 

## 2014-08-04 NOTE — ED Notes (Signed)
Bed: [id number] Expected date:  Expected time:  Means of arrival:  Comments: EMS 54 yo abdominal pain for 1 hour

## 2014-08-04 NOTE — ED Notes (Signed)
Pt. Made aware for the need of urine. 

## 2014-08-04 NOTE — Transfer of Care (Signed)
Immediate Anesthesia Transfer of Care Note  Patient: Travis Larson  Procedure(s) Performed: Procedure(s): EXPLORATORY LAPAROTOMY, SIGMOID COLECTOMY END COLOSTOMY HARTMAN'S PATCH (N/A)  Patient Location: PACU  Anesthesia Type:General  Level of Consciousness: awake, alert  and oriented  Airway & Oxygen Therapy: Patient Spontanous Breathing and Patient connected to face mask oxygen  Post-op Assessment: Report given to RN and Post -op Vital signs reviewed and stable  Post vital signs: Reviewed and stable  Last Vitals:  Filed Vitals:   08/04/14 0415  BP: 131/89  Pulse: 122  Temp:   Resp: 32    Complications: No apparent anesthesia complications

## 2014-08-04 NOTE — ED Notes (Signed)
Report called to CRNA 

## 2014-08-04 NOTE — ED Notes (Signed)
Pt given urinal to give us a urine sample

## 2014-08-04 NOTE — ED Notes (Signed)
Per EMS pt from work, started having severe lower abdominal pain x 1 hour ago, Monday went to his doctor d/t problems w/ urination, was given flomax and has had no problems since, has voided 2 hours ago w/ no difficulty, did take oxycotin 30 minutes ago for pain that is not his, pain 10/10. Pt states nauseous from pain, denies vomiting or diarrhea.

## 2014-08-04 NOTE — Anesthesia Procedure Notes (Signed)
Procedure Name: Intubation Date/Time: 08/04/2014 5:14 AM Performed by: Thornell MuleSTUBBLEFIELD, Jazzy Parmer G Pre-anesthesia Checklist: Patient identified, Emergency Drugs available, Suction available and Patient being monitored Patient Re-evaluated:Patient Re-evaluated prior to inductionOxygen Delivery Method: Circle System Utilized Preoxygenation: Pre-oxygenation with 100% oxygen Intubation Type: IV induction Ventilation: Mask ventilation without difficulty Laryngoscope Size: Miller and 3 Grade View: Grade I Tube type: Oral Tube size: 7.5 mm Number of attempts: 1 Airway Equipment and Method: Stylet and Oral airway Placement Confirmation: ETT inserted through vocal cords under direct vision,  positive ETCO2 and breath sounds checked- equal and bilateral Secured at: 21 cm Tube secured with: Tape Dental Injury: Teeth and Oropharynx as per pre-operative assessment

## 2014-08-04 NOTE — Consult Note (Addendum)
WOC ostomy consult note Stoma type/location: Pt just arrived from PACU after ostomy surgery this AM; Colostomy to LLQ Stomal assessment/size:  Stoma red and viable when visualized through pouch which is intact with good seal. Output: Scant amt pink drainage in pouch Ostomy pouching: 1pc.  Education provided:  Pt is groggy and in pain; WOC team will begin to follow for educational sessions on Monday.  Supplies left at bedside for staff nurse use. [name] MSN, RN, CWOCN, [place]AP, CNS [phone]

## 2014-08-04 NOTE — Anesthesia Postprocedure Evaluation (Signed)
  Anesthesia Post-op Note  Patient: Travis Larson  Procedure(s) Performed: Procedure(s) (LRB): EXPLORATORY LAPAROTOMY, SIGMOID COLECTOMY END COLOSTOMY HARTMAN'S PATCH (N/A)  Patient Location: PACU  Anesthesia Type: General  Level of Consciousness: awake and alert   Airway and Oxygen Therapy: Patient Spontanous Breathing  Post-op Pain: mild  Post-op Assessment: Post-op Vital signs reviewed, Patient's Cardiovascular Status Stable, Respiratory Function Stable, Patent Airway and No signs of Nausea or vomiting  Last Vitals:  Filed Vitals:   08/04/14 1000  BP: 144/81  Pulse: 111  Temp:   Resp: 18    Post-op Vital Signs: stable   Complications: No apparent anesthesia complications

## 2014-08-04 NOTE — Anesthesia Postprocedure Evaluation (Signed)
  Anesthesia Post-op Note  Patient: Travis Larson  Procedure(s) Performed: Procedure(s) (LRB): EXPLORATORY LAPAROTOMY, SIGMOID COLECTOMY END COLOSTOMY HARTMAN'S PATCH (N/A)  Patient Location: PACU  Anesthesia Type: General  Level of Consciousness: awake and alert   Airway and Oxygen Therapy: Patient Spontanous Breathing  Post-op Pain: mild  Post-op Assessment: Post-op Vital signs reviewed, Patient's Cardiovascular Status Stable, Respiratory Function Stable, Patent Airway and No signs of Nausea or vomiting  Last Vitals:  Filed Vitals:   08/04/14 0856  BP:   Pulse:   Temp: 37.4 C  Resp:     Post-op Vital Signs: stable   Complications: No apparent anesthesia complications

## 2014-08-04 NOTE — Progress Notes (Signed)
RT was asked to assess pt for low o2 sats and apnea.  RT arrived to pt bedside, HR99, RR15, spo2 91% on 45% Ventimask.  BBS clear.  etco2 33 with morphine pca in place.  Pt is awake, alert, answering questions appropriately, no respiratory distress or apnea noted.  Cpap and/or breathing treatments not indicated at this time.  Ventimask increased to 55%, o2 sats now 94-95%, RN notified.

## 2014-08-04 NOTE — Progress Notes (Signed)
Paged on call MD about the Pt's 02 saturation and Apnea alarms continuously alarming. Orders given to consult to Respiratory. Therapist paged will continue to monitor. Estill DoomsSimon, Aleasha Fregeau Mahario, RN 9:37 PM 08/04/2014

## 2014-08-04 NOTE — Anesthesia Preprocedure Evaluation (Addendum)
Anesthesia Evaluation  Patient identified by MRN, date of birth, ID band Patient awake    Reviewed: Allergy & Precautions, NPO status , Patient's Chart, lab work & pertinent test results  Airway Mallampati: II  TM Distance: >3 FB Neck ROM: Full   Comment: Denies any symptoms from neck fracture and plating. States full ROM Dental no notable dental hx.    Pulmonary neg pulmonary ROS, Current Smoker,  breath sounds clear to auscultation  Pulmonary exam normal       Cardiovascular negative cardio ROS Normal cardiovascular examRhythm:Regular Rate:Tachycardia     Neuro/Psych Seizures -, Well Controlled,  negative psych ROS   GI/Hepatic negative GI ROS, Neg liver ROS,   Endo/Other  negative endocrine ROS  Renal/GU negative Renal ROS  negative genitourinary   Musculoskeletal negative musculoskeletal ROS (+)   Abdominal   Peds negative pediatric ROS (+)  Hematology negative hematology ROS (+)   Anesthesia Other Findings   Reproductive/Obstetrics negative OB ROS                           Anesthesia Physical Anesthesia Plan  ASA: III and emergent  Anesthesia Plan: General   Post-op Pain Management:    Induction: Intravenous  Airway Management Planned: Oral ETT  Additional Equipment:   Intra-op Plan:   Post-operative Plan: Extubation in OR  Informed Consent: I have reviewed the patients History and Physical, chart, labs and discussed the procedure including the risks, benefits and alternatives for the proposed anesthesia with the patient or authorized representative who has indicated his/her understanding and acceptance.   Dental advisory given  Plan Discussed with: CRNA  Anesthesia Plan Comments:         Anesthesia Quick Evaluation

## 2014-08-04 NOTE — H&P (Signed)
Re:   Travis Larson DOB:   1960-12-14 MRN:   [id number]   [facility]  ASSESSMENT AND PLAN: 1.  Diverticulits, with perforation  I reviewed with the patient the findings and need for colon surgery.  I discussed both surgical and non surgical options.   I reviewed the risks of surgery, including, but not limited to, infection, bleeding, nerve injury, and probability of colostomy.  He is acutely ill with diffuse peritonitis and elevated lactic acid.  He needs to go to the OR now.  Plan colectomy of perforated colon and colostomy.  Then ICU.  2.  Gallstones 3.  Remote history of seizures  Last seizure >6 years ago.  He does not see a neurologist and is on no meds 4.  Broke neck 2 years ago in [place]  [facility].  He said he injured blood vessel in right neck. 5.  Smokes 1 ppd  Chief Complaint  Patient presents with  . Abdominal Pain   REFERRING PHYSICIAN:  Earley FavorGail Schulz, Maryjean KaPA/Olga Otter, MD, [facility]  HISTORY OF PRESENT ILLNESS: Travis Larson is a 54 y.o. (DOB: 1960-12-14)  white  male whose primary care physician is Milana ObeyKNOWLTON,STEPHEN D, MD (he said that this is not his PCP anymore, but he could not remember the name of his PCP) [facility].  His symptoms started Sunday, 5/1.  He had some vague abdominal pain.  He went to see [place] PCP (? Name) in [place] on Monday, 5/2, who gave him Flomax for what was thought to be urinary retention.  He got a little better until Wednesday, 5/4, when the pain started getting worse.  Then last PM the pain got acutely worse and [facility].  He came from work.  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   He has no history of stomach disease.  No history of liver disease.  No history of gall bladder disease.  No history of pancreas disease.  No history of colon disease.  No prior colonoscopy.  CT scan - 08/04/2014 - Acute diverticulitis of the mid sigmoid colon  with perforation and widespread peritoneal contamination. There is small volume free air scattered throughout the peritoneum.  *Perihepatic and perisplenic fluid, small volume. There also is a small volume fluid collected dependently in the pelvis.  *Cholelithiasis WBC - 11,900 - 08/04/2014 Lactic Acid - 4.07 - 08/04/2014  He is by himself.  I called his wife, Travis Larson, and reviewed the findings and plan for surgery.  Past Medical History  Diagnosis Date  . Seizures       Past Surgical History  Procedure Laterality Date  . Fracture surgery      neck      Current Facility-Administered Medications  Medication Dose Route Frequency Provider Last Rate Last Dose  . 0.9 %  sodium chloride infusion  1,000 mL Intravenous Continuous Marisa Severinlga Otter, MD      . piperacillin-tazobactam (ZOSYN) IVPB 3.375 g  3.375 g Intravenous Once Earley FavorGail Schulz, NP 12.5 mL/hr at 08/04/14 0319 3.375 g at 08/04/14 0319  . sodium chloride 0.9 % bolus 2,000 mL  2,000 mL Intravenous Once Earley FavorGail Schulz, NP 2,000 mL/hr at 08/04/14 0327 2,000 mL at 08/04/14 0327   Current Outpatient Prescriptions  Medication Sig Dispense Refill  . tamsulosin (FLOMAX) 0.4 MG CAPS capsule Take 1 capsule by mouth daily.  0  .  predniSONE (DELTASONE) 20 MG tablet 3 tabs po daily x 3 days, then 2 tabs x 3 days, then 1.5 tabs x 3 days, then 1 tab x 3 days, then 0.5 tabs x 3 days (Patient not taking: Reported on 08/04/2014) 27 tablet 0     No Known Allergies  REVIEW OF SYSTEMS: Skin:  No history of rash.  No history of abnormal moles. Infection:  No history of hepatitis or HIV.  No history of MRSA. Neurologic:  History of seizure.  Last seizure > 6 years.  On no meds and does not see neurology. Cardiac:  No history of hypertension. No history of heart disease.  No history of prior cardiac catheterization.  No history of seeing a cardiologist. Pulmonary:  Smokes 1 ppd  Endocrine:  No diabetes. No thyroid disease. Gastrointestinal:  See HPI Urologic:   No history of kidney stones.  No history of bladder infections. Musculoskeletal:  Neck fracture 2014 from [place].  [facility].  No problems now. Hematologic:  No bleeding disorder.  No history of anemia.  Not anticoagulated. Psycho-social:  The patient is oriented.   The patient has no obvious psychologic or social impairment to understanding our conversation and plan.  SOCIAL and FAMILY HISTORY: Married.  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  PHYSICAL EXAM: BP 125/83 mmHg  Pulse 109  Temp(Src) 97.3 F (36.3 C) (Oral)  Resp 22  SpO2 98%  General: Sick appearing WM who is alert.  He is uncomfortable with diffuse abdominal pain. HEENT: Normal. Pupils equal. Neck: Supple. No mass.  No thyroid mass. Lymph Nodes:  No supraclavicular or cervical nodes. Lungs: Clear to auscultation and symmetric breath sounds.  Poor inspiratory effort because of the abdominal pain. Heart:  Tachycardic. No murmur or rub.  Abdomen:  No hernia. No bowel sounds.  No abdominal scars.  Diffuse abdominal tenderness with a board like abdomen. Rectal: Not done. Extremities:  Good strength and ROM  in upper and lower extremities. Neurologic:  Grossly intact to motor and sensory function.  DATA REVIEWED: Epic data.  Ovidio Kinavid Kadir Azucena, MD,  [facility] [address]  [address]   [place], [place]    [id number] Phone:  [phone] FAX:  [phone]

## 2014-08-04 NOTE — ED Notes (Signed)
Pt states went to his PCP on Monday d/t Sunday having difficulty urinating, states was given flomax and told it could be prostate issues, about an hour ago started having severe lower abdominal pain. Bladder scanner done, showed 85ml

## 2014-08-04 NOTE — ED Notes (Signed)
Patient transported to X-ray 

## 2014-08-05 ENCOUNTER — Inpatient Hospital Stay (HOSPITAL_COMMUNITY): Payer: BLUE CROSS/BLUE SHIELD

## 2014-08-05 LAB — BASIC METABOLIC PANEL
Anion gap: 4 — ABNORMAL LOW (ref 5–15)
BUN: 11 mg/dL (ref 6–20)
CO2: 24 mmol/L (ref 22–32)
Calcium: 7.4 mg/dL — ABNORMAL LOW (ref 8.9–10.3)
Chloride: 108 mmol/L (ref 101–111)
Creatinine, Ser: 0.84 mg/dL (ref 0.61–1.24)
GFR calc Af Amer: >60 mL/min (ref >60)
GFR calc non Af Amer: >60 mL/min (ref >60)
Glucose, Bld: 99 mg/dL (ref 70–99)
Potassium: 4.1 mmol/L (ref 3.5–5.1)
Sodium: 136 mmol/L (ref 135–145)

## 2014-08-05 LAB — GLUCOSE, CAPILLARY
Glucose-Capillary: 93 mg/dL (ref 70–99)
Glucose-Capillary: 94 mg/dL (ref 70–99)

## 2014-08-05 LAB — CBC
HCT: 37.3 % — ABNORMAL LOW (ref 39.0–52.0)
Hemoglobin: 13 g/dL (ref 13.0–17.0)
MCH: 32.9 pg (ref 26.0–34.0)
MCHC: 34.9 g/dL (ref 30.0–36.0)
MCV: 94.4 fL (ref 78.0–100.0)
Platelets: 203 10*3/uL (ref 150–400)
RBC: 3.95 MIL/uL — ABNORMAL LOW (ref 4.22–5.81)
RDW: 13.1 % (ref 11.5–15.5)
WBC: 12.2 10*3/uL — ABNORMAL HIGH (ref 4.0–10.5)

## 2014-08-05 MED ORDER — FUROSEMIDE 10 MG/ML IJ SOLN
20.0000 mg | Freq: Once | INTRAMUSCULAR | Status: AC
  Administered 2014-08-05: 20 mg via INTRAVENOUS
  Filled 2014-08-05: qty 2

## 2014-08-05 NOTE — Progress Notes (Signed)
1 Day Post-Op Ex lap Hartman's  Subjective: Pt on VM and unable to wean, denies SOB.  Good UOP.  Pain controlled.    Objective: Vital signs in last 24 hours: Temp:  [97.8 F (36.6 C)-99.3 F (37.4 C)] 99.3 F (37.4 C) (05/07 0400) Pulse Rate:  [87-119] 96 (05/07 0800) Resp:  [9-26] 18 (05/07 0800) BP: (121-165)/(52-109) 135/70 mmHg (05/07 0800) SpO2:  [86 %-97 %] 97 % (05/07 0800) FiO2 (%):  [40 %-55 %] 55 % (05/07 0800) Weight:  [92.4 kg (203 lb 11.3 oz)-96.6 kg (212 lb 15.4 oz)] 92.4 kg (203 lb 11.3 oz) (05/07 0400)   Intake/Output from previous day: 05/06 0701 - 05/07 0700 In: 3747 [I.V.:3667; NG/GT:30; IV Piggyback:50] Out: 3220 [Urine:2370; Emesis/NG output:700; Blood:150] Intake/Output this shift: Total I/O In: 2050 [I.V.:1950; IV Piggyback:100] Out: 450 [Emesis/NG output:450]   General appearance: alert and cooperative GI: soft, distended Ostomy: edematous, dark red Incision: no significant drainage  Lab Results:   Recent Labs  08/04/14 0142 08/04/14 0146 08/05/14 0420  WBC 11.9*  --  12.2*  HGB 15.9 16.3 13.0  HCT 44.7 48.0 37.3*  PLT 251  --  203   BMET  Recent Labs  08/04/14 0146 08/05/14 0420  NA 140 136  K 3.5 4.1  CL 99* 108  CO2  --  24  GLUCOSE 182* 99  BUN 16 11  CREATININE 0.90 0.84  CALCIUM  --  7.4*   PT/INR No results for input(s): LABPROT, INR in the last 72 hours. ABG No results for input(s): PHART, HCO3 in the last 72 hours.  Invalid input(s): PCO2, PO2  MEDS, Scheduled . acetaminophen  1,000 mg Oral 4 times per day  . heparin subcutaneous  5,000 Units Subcutaneous 3 times per day  . morphine   Intravenous 6 times per day  . piperacillin-tazobactam (ZOSYN)  IV  3.375 g Intravenous Q8H    Studies/Results: Ct Abdomen Pelvis W Contrast  08/04/2014   CLINICAL DATA:  Severe lower abdominal pain  EXAM: CT ABDOMEN AND PELVIS WITH CONTRAST  TECHNIQUE: Multidetector CT imaging of the abdomen and pelvis was performed using the  standard protocol following bolus administration of intravenous contrast.  CONTRAST:  50mL OMNIPAQUE IOHEXOL 300 MG/ML SOLN, 100mL OMNIPAQUE IOHEXOL 300 MG/ML SOLN  COMPARISON:  None.  FINDINGS: There is acute inflammatory change surrounding a diverticulum of the mid sigmoid colon. There are widely scattered bubbles of extraluminal air around the involved portion of the sigmoid and also extending more widely throughout the peritoneum. There is no defined abscess. There is a small volume free fluid in the dependent pelvis and there also is a small volume fluid around the liver and around the spleen.  There are normal appearances of the liver, spleen, pancreas, adrenals and kidneys.  Multiple calculi are present in the gallbladder, measuring up to 11 mm. There is no bile duct dilatation.  The abdominal aorta is normal in caliber with mild atherosclerotic calcification.  There is no significant abnormality in the lower chest.  IMPRESSION: *Acute diverticulitis of the mid sigmoid colon with perforation and widespread peritoneal contamination. There is small volume free air scattered throughout the peritoneum. *Perihepatic and perisplenic fluid, small volume. There also is a small volume fluid collected dependently in the pelvis. *Cholelithiasis   Electronically Signed   By: Ellery Plunkaniel R Mitchell M.D.   On: 08/04/2014 03:26   Dg Chest Port 1 View  08/04/2014   CLINICAL DATA:  Central placement .  EXAM: PORTABLE CHEST - 1  VIEW  COMPARISON:  None.  FINDINGS: Right IJ line noted with tip projected over cavoatrial junction. NG tube noted with tip below left hemidiaphragm. Cardiomegaly with diffuse bilateral pulmonary interstitial prominence consistent with congestive heart failure. Bilateral pneumonitis cannot be excluded. Basilar atelectasis. No acute bony abnormality.  IMPRESSION: 1. Right IJ line noted with tip projected over cavoatrial junction. NG tube noted with tip below left hemidiaphragm. 2. Cardiomegaly with diffuse  pulmonary interstitial prominence consistent congestive heart failure. Pneumonitis cannot be excluded. 3. Low lung volumes with bibasilar subsegmental atelectasis.   Electronically Signed   By: Maisie Fushomas  Register   On: 08/04/2014 08:20   Dg Abd Acute W/chest  08/04/2014   CLINICAL DATA:  Increasing inguinal pain for the past 2 days. Nausea for  EXAM: DG ABDOMEN ACUTE W/ 1V CHEST  COMPARISON:  None.  FINDINGS: There are slightly prominent small bowel loops in the left mid abdomen with a suggestion of thumbprinting. This suggests mural edema. Air is scattered throughout the colon to the rectum. There is no free intraperitoneal air. No urinary or biliary calculi are evident.  IMPRESSION: Mildly prominent mid abdominal small bowel loops with a suggestion of mural edema. This is more likely nonobstructive.   Electronically Signed   By: Ellery Plunkaniel R Mitchell M.D.   On: 08/04/2014 02:02    Assessment: s/p Procedure(s): EXPLORATORY LAPAROTOMY, SIGMOID COLECTOMY END COLOSTOMY HARTMAN'S PATCH Patient Active Problem List   Diagnosis Date Noted  . Perforated diverticulum of large intestine 08/04/2014  . Perforated sigmoid colon 08/04/2014    Pt with significant hypoxia after surgery.  Unable to wean VM.  Plan: will get CXR and resp consult  , decrease MIV rate (UOP excellent) Cont incentive spirometry OOB with assistance Cont NPO Wet to dry dressing changes   LOS: 1 day     .Vanita PandaAlicia C Kennedy Brines, MD [facility], [place] [phone]   08/05/2014 8:27 AM

## 2014-08-06 LAB — URINALYSIS, ROUTINE W REFLEX MICROSCOPIC
Bilirubin Urine: NEGATIVE
Glucose, UA: NEGATIVE mg/dL
LEUKOCYTES UA: NEGATIVE
Nitrite: NEGATIVE
PH: 7 (ref 5.0–8.0)
PROTEIN: NEGATIVE mg/dL
Specific Gravity, Urine: 1.022 (ref 1.005–1.030)
Urobilinogen, UA: 1 mg/dL (ref 0.0–1.0)

## 2014-08-06 LAB — URINE MICROSCOPIC-ADD ON

## 2014-08-06 MED ORDER — CETYLPYRIDINIUM CHLORIDE 0.05 % MT LIQD
7.0000 mL | Freq: Two times a day (BID) | OROMUCOSAL | Status: DC
  Administered 2014-08-06: 7 mL via OROMUCOSAL

## 2014-08-06 MED ORDER — CHLORHEXIDINE GLUCONATE 0.12 % MT SOLN
15.0000 mL | Freq: Two times a day (BID) | OROMUCOSAL | Status: DC
  Administered 2014-08-06: 15 mL via OROMUCOSAL
  Filled 2014-08-06: qty 15

## 2014-08-06 MED ORDER — FUROSEMIDE 10 MG/ML IJ SOLN
20.0000 mg | Freq: Once | INTRAMUSCULAR | Status: AC
  Administered 2014-08-06: 20 mg via INTRAVENOUS
  Filled 2014-08-06: qty 2

## 2014-08-06 NOTE — Progress Notes (Signed)
2 Days Post-Op Ex lap Hartman's  Subjective: Pt on weaned to [place].  Good UOP after lasix yesterday.  Pain controlled.    Objective: Vital signs in last 24 hours: Temp:  [98.2 F (36.8 C)-99.7 F (37.6 C)] 99.4 F (37.4 C) (05/08 0400) Pulse Rate:  [90-106] 98 (05/08 0700) Resp:  [11-33] 12 (05/08 0700) BP: (110-169)/(63-100) 148/72 mmHg (05/08 0700) SpO2:  [88 %-98 %] 95 % (05/08 0700) FiO2 (%):  [35 %] 35 % (05/08 0500)   Intake/Output from previous day: 05/07 0701 - 05/08 0700 In: 3518.3 [I.V.:3148.3; NG/GT:120; IV Piggyback:250] Out: 4925 [Urine:3475; Emesis/NG output:1450] Intake/Output this shift:   General appearance: alert and cooperative GI: soft, distended Ostomy: edematous, dark red Incision: no significant drainage  Lab Results:   Recent Labs  08/04/14 0142 08/04/14 0146 08/05/14 0420  WBC 11.9*  --  12.2*  HGB 15.9 16.3 13.0  HCT 44.7 48.0 37.3*  PLT 251  --  203   BMET  Recent Labs  08/04/14 0146 08/05/14 0420  NA 140 136  K 3.5 4.1  CL 99* 108  CO2  --  24  GLUCOSE 182* 99  BUN 16 11  CREATININE 0.90 0.84  CALCIUM  --  7.4*   PT/INR No results for input(s): LABPROT, INR in the last 72 hours. ABG No results for input(s): PHART, HCO3 in the last 72 hours.  Invalid input(s): PCO2, PO2  MEDS, Scheduled . antiseptic oral rinse  7 mL Mouth Rinse q12n4p  . chlorhexidine  15 mL Mouth Rinse BID  . furosemide  20 mg Intravenous Once  . heparin subcutaneous  5,000 Units Subcutaneous 3 times per day  . morphine   Intravenous 6 times per day  . piperacillin-tazobactam (ZOSYN)  IV  3.375 g Intravenous Q8H    Studies/Results: Dg Chest Port 1 View  08/05/2014   CLINICAL DATA:  Hypoxia  EXAM: PORTABLE CHEST - 1 VIEW  COMPARISON:  the previous day's study  FINDINGS: Nasogastric tube and right IJ central line are stable in position. Coarse interstitial and airspace opacities, slightly increased on the left since previous exam. Partial improvement in  the consolidation/atelectasis previously seen at the right lung base. Heart size remains upper limits normal for technique. Atheromatous aorta. No effusion. Cervical fixation hardware partially seen.  IMPRESSION: 1. Some redistribution of asymmetric infiltrates or edema since previous day's exam, without significant overall change in severity.   Electronically Signed   By: Corlis Leak  Hassell M.D.   On: 08/05/2014 09:15   Dg Chest Port 1 View  08/04/2014   CLINICAL DATA:  Central placement .  EXAM: PORTABLE CHEST - 1 VIEW  COMPARISON:  None.  FINDINGS: Right IJ line noted with tip projected over cavoatrial junction. NG tube noted with tip below left hemidiaphragm. Cardiomegaly with diffuse bilateral pulmonary interstitial prominence consistent with congestive heart failure. Bilateral pneumonitis cannot be excluded. Basilar atelectasis. No acute bony abnormality.  IMPRESSION: 1. Right IJ line noted with tip projected over cavoatrial junction. NG tube noted with tip below left hemidiaphragm. 2. Cardiomegaly with diffuse pulmonary interstitial prominence consistent congestive heart failure. Pneumonitis cannot be excluded. 3. Low lung volumes with bibasilar subsegmental atelectasis.   Electronically Signed   By: Maisie Fushomas  Register   On: 08/04/2014 08:20    Assessment: s/p Procedure(s): EXPLORATORY LAPAROTOMY, SIGMOID COLECTOMY END COLOSTOMY HARTMAN'S PATCH Patient Active Problem List   Diagnosis Date Noted  . Perforated diverticulum of large intestine 08/04/2014  . Perforated sigmoid colon 08/04/2014    Pt  with significant hypoxia after surgery, better with diuresis.    Plan: Will give another dose of lasix today to cont to wean O2 Cont incentive spirometry OOB with assistance Cont NPO, NG Wet to dry dressing changes Transfer to floor   LOS: 2 days     .Vanita PandaAlicia C Laren Orama, MD [facility], [place] [phone]   08/06/2014 8:06 AM

## 2014-08-06 NOTE — Progress Notes (Signed)
Pt wearing 35% venti mask and 3L [place]. 3-6L [place] insufficient to maintain sats >90 so venti was applied.  [place] left in place for ETCO2 monitoring but pt wanted some oxygen flow because it "gets too hot" without. Pt mouth breathing while sleeping-will attempt to d/c venti when awake.

## 2014-08-07 ENCOUNTER — Encounter (HOSPITAL_COMMUNITY): Payer: Self-pay | Admitting: Surgery

## 2014-08-07 LAB — BASIC METABOLIC PANEL
Anion gap: 8 (ref 5–15)
BUN: 17 mg/dL (ref 6–20)
CALCIUM: 7.6 mg/dL — AB (ref 8.9–10.3)
CO2: 25 mmol/L (ref 22–32)
Chloride: 101 mmol/L (ref 101–111)
Creatinine, Ser: 0.69 mg/dL (ref 0.61–1.24)
GFR calc Af Amer: >60 mL/min (ref >60)
GFR calc non Af Amer: >60 mL/min (ref >60)
GLUCOSE: 101 mg/dL — AB (ref 70–99)
Potassium: 3.5 mmol/L (ref 3.5–5.1)
SODIUM: 134 mmol/L — AB (ref 135–145)

## 2014-08-07 LAB — BODY FLUID CULTURE

## 2014-08-07 LAB — CBC
HCT: 37 % — ABNORMAL LOW (ref 39.0–52.0)
Hemoglobin: 12.9 g/dL — ABNORMAL LOW (ref 13.0–17.0)
MCH: 32.5 pg (ref 26.0–34.0)
MCHC: 34.9 g/dL (ref 30.0–36.0)
MCV: 93.2 fL (ref 78.0–100.0)
PLATELETS: 264 10*3/uL (ref 150–400)
RBC: 3.97 MIL/uL — ABNORMAL LOW (ref 4.22–5.81)
RDW: 12.8 % (ref 11.5–15.5)
WBC: 10.4 10*3/uL (ref 4.0–10.5)

## 2014-08-07 NOTE — Progress Notes (Addendum)
General Surgery Note  LOS: 3 days  POD -  3 Days Post-Op  Assessment/Plan: 1.  EXPLORATORY LAPAROTOMY, SIGMOID COLECTOMY, END COLOSTOMY,  HARTMAN'S PATCH - 08/04/2014 - D. Katilin Raynes  For perforated diverticulitis - path pending  Zosyn - 5/6 >>>  WBC improving  Will try NGT, but keep NPO  1A.  Open wound  Clean  2. Gallstones 3. Remote history of seizures Last seizure >6 years ago. He does not see a neurologist and is on no meds 4. Broke neck 2 years ago in [place] [facility]. He said he injured blood vessel in right neck. 5. Smokes 1 ppd 6.  Foley - to remove today 7.  DVT prophylaxis - SQ Heparin   Active Problems:   Perforated diverticulum of large intestine   Perforated sigmoid colon  Subjective:  Feels better.  Breathing better.  No flatus in bag yet. Objective:   Filed Vitals:   08/07/14 0739  BP:   Pulse:   Temp:   Resp: 14     Intake/Output from previous day:  05/08 0701 - 05/09 0700 In: 1332 [I.V.:1150; NG/GT:30; IV Piggyback:150] Out: 2475 [Urine:2225; Emesis/NG output:250]  Intake/Output this shift:      Physical Exam:   General: WN WM who is alert and oriented.    HEENT: Normal. Pupils equal. .   Lungs: Clear.  IS - 1,200 cc   Abdomen: Mild distention.  BS present.   Wound: Clean midline wound.  The ostomy is a little hard to assess.  There is some pink, but it is covered in clot/stool.   Lab Results:    Recent Labs  08/05/14 0420 08/07/14 0550  WBC 12.2* 10.4  HGB 13.0 12.9*  HCT 37.3* 37.0*  PLT 203 264    BMET   Recent Labs  08/05/14 0420 08/07/14 0550  NA 136 134*  K 4.1 3.5  CL 108 101  CO2 24 25  GLUCOSE 99 101*  BUN 11 17  CREATININE 0.84 0.69  CALCIUM 7.4* 7.6*    PT/INR  No results for input(s): LABPROT, INR in the last 72 hours.  ABG  No results for input(s): PHART, HCO3 in the last 72 hours.  Invalid input(s): PCO2, PO2   Studies/Results:  No results  found.   Anti-infectives:   Anti-infectives    Start     Dose/Rate Route Frequency Ordered Stop   08/04/14 1200  piperacillin-tazobactam (ZOSYN) IVPB 3.375 g     3.375 g 12.5 mL/hr over 240 Minutes Intravenous Every 8 hours 08/04/14 0941     08/04/14 0315  piperacillin-tazobactam (ZOSYN) IVPB 3.375 g     3.375 g 12.5 mL/hr over 240 Minutes Intravenous  Once 08/04/14 0312 08/04/14 0719      Ovidio Kinavid Vielka Klinedinst, MD, FACS Pager: [phone] [facility] Office: [phone] 08/07/2014

## 2014-08-07 NOTE — Consult Note (Signed)
WOC ostomy consult note Stoma type/location: LLQ colostomy Stomal assessment/size: 1 and 3/4 inch round, moist, budded, os at center.  Bulge beneath stoma noted.  Peristomal assessment: Intact, erythematous. No warmth, edema or induration Treatment options for stomal/peristomal skin: none indicated Output Scant serosanguinous effluent Ostomy pouching: 2pc. 2 and 1/4 inch pouching system.    Education provided: Patient is exhausted today and uncomfortable.  Was trying no to use PCA and movement "caught up with him".  Brother visiting, states that Kateri McUncle had a colostomy for colorectal cancer and had a great sense of humor. Patient is experiencing fatigue and uses PCA and closes his eyes during my session.  Old pouch (OR-issued 1-piece Kar[place] with clamp) removed, stoma assessed (measured) and patient states that he will look more closely next time.  Reassured that stoma is healthy.  Patient education booklet left at bedside for patient's use in am. WOC nursing team will follow, and will remain available to this patient, the nursing, surgical and medical teams.  My partners will follow this nice gentleman in my absence. Thanks, Ladona MowLaurie Nisha Dhami, MSN, RN, GNP, [place], CWON-AP [phone])

## 2014-08-08 LAB — CBC WITH DIFFERENTIAL/PLATELET
Basophils Absolute: 0 10*3/uL (ref 0.0–0.1)
Basophils Relative: 0 % (ref 0–1)
EOS PCT: 1 % (ref 0–5)
Eosinophils Absolute: 0.1 10*3/uL (ref 0.0–0.7)
HEMATOCRIT: 38.9 % — AB (ref 39.0–52.0)
Hemoglobin: 13.4 g/dL (ref 13.0–17.0)
LYMPHS PCT: 12 % (ref 12–46)
Lymphs Abs: 1 10*3/uL (ref 0.7–4.0)
MCH: 32.1 pg (ref 26.0–34.0)
MCHC: 34.4 g/dL (ref 30.0–36.0)
MCV: 93.1 fL (ref 78.0–100.0)
Monocytes Absolute: 0.7 10*3/uL (ref 0.1–1.0)
Monocytes Relative: 9 % (ref 3–12)
NEUTROS PCT: 78 % — AB (ref 43–77)
Neutro Abs: 6.4 10*3/uL (ref 1.7–7.7)
Platelets: 312 10*3/uL (ref 150–400)
RBC: 4.18 MIL/uL — ABNORMAL LOW (ref 4.22–5.81)
RDW: 12.7 % (ref 11.5–15.5)
WBC: 8.2 10*3/uL (ref 4.0–10.5)

## 2014-08-08 MED ORDER — OXYCODONE-ACETAMINOPHEN 5-325 MG PO TABS
1.0000 | ORAL_TABLET | ORAL | Status: DC | PRN
  Administered 2014-08-08 – 2014-08-10 (×5): 2 via ORAL
  Filled 2014-08-08 (×5): qty 2

## 2014-08-08 MED ORDER — MORPHINE SULFATE 2 MG/ML IJ SOLN: 2.0000 mg | INTRAMUSCULAR | Status: DC | PRN

## 2014-08-08 MED ORDER — SODIUM CHLORIDE 0.9 % IJ SOLN
10.0000 mL | INTRAMUSCULAR | Status: DC | PRN
  Administered 2014-08-08 (×2): 10 mL
  Filled 2014-08-08: qty 40

## 2014-08-08 NOTE — Progress Notes (Signed)
Patient ID: Travis Larson, male   DOB: September 01, 1960, 54 y.o.   MRN: [id number]     [address] [place], [place] [address]    Phone: [phone] FAX: [phone]     Subjective: Flatus in bag.  Pain is okay.  Voiding.  Afebrile.  VSS.    Objective:  Vital signs:  Filed Vitals:   08/08/14 0050 08/08/14 0148 08/08/14 0447 08/08/14 0557  BP:  135/86 126/75   Pulse:  88 88   Temp:  98.2 F (36.8 C) 97.8 F (36.6 C)   TempSrc:  Oral Oral   Resp: _0 Height:      Weight:      SpO2: 98% 94% 97% 98%    Last BM Date:  (colostomy)  Intake/Output   Yesterday:  05/09 0701 - 05/10 0700 In: 600 [I.V.:600] Out: 850 [Urine:850] This shift:    I/O last 3 completed shifts: In: 9528 [I.V.:1220; Other:2; IV Piggyback:100] Out: 4132 [GMWNU:2725; Emesis/NG output:75]    Physical Exam: General: Pt awake/alert/oriented x4 in no acute distress Chest: cta.  No chest wall pain w good excursion CV:  Pulses intact.  Regular rhythm MS: Normal AROM mjr joints.  No obvious deformity Abdomen: Soft.  +BS. distended.  Tender to incision.  Wound is clean, repacked.  Colostomy is purple, no stool, but air is present.    No evidence of peritonitis.  No incarcerated hernias. Ext:  SCDs BLE.  No mjr edema.  No cyanosis Skin: No petechiae / purpura   Problem List:   Active Problems:   Perforated diverticulum of large intestine   Perforated sigmoid colon    Results:   Labs: Results for orders placed or performed during the hospital encounter of 08/04/14 (from the past 48 hour(s))  CBC     Status: Abnormal   Collection Time: 08/07/14  5:50 AM  Result Value Ref Range   WBC 10.4 4.0 - 10.5 K/uL   RBC 3.97 (L) 4.22 - 5.81 MIL/uL   Hemoglobin 12.9 (L) 13.0 - 17.0 g/dL   HCT 37.0 (L) 39.0 - 52.0 %   MCV 93.2 78.0 - 100.0 fL   MCH 32.5 26.0 - 34.0 pg   MCHC 34.9 30.0 - 36.0 g/dL   RDW 12.8 11.5 - 15.5 %   Platelets 264  150 - 400 K/uL  Basic metabolic panel     Status: Abnormal   Collection Time: 08/07/14  5:50 AM  Result Value Ref Range   Sodium 134 (L) 135 - 145 mmol/L   Potassium 3.5 3.5 - 5.1 mmol/L   Chloride 101 101 - 111 mmol/L   CO2 25 22 - 32 mmol/L   Glucose, Bld 101 (H) 70 - 99 mg/dL   BUN 17 6 - 20 mg/dL   Creatinine, Ser 0.69 0.61 - 1.24 mg/dL   Calcium 7.6 (L) 8.9 - 10.3 mg/dL   GFR calc non Af Amer >60 >60 mL/min   GFR calc Af Amer >60 >60 mL/min    Comment: (NOTE) The eGFR has been calculated using the CKD EPI equation. This calculation has not been validated in all clinical situations. eGFR's persistently <60 mL/min signify possible Chronic Kidney Disease.    Anion gap 8 5 - 15  CBC with Differential/Platelet     Status: Abnormal   Collection Time: 08/08/14  4:36 AM  Result Value Ref Range   WBC 8.2 4.0 - 10.5  K/uL   RBC 4.18 (L) 4.22 - 5.81 MIL/uL   Hemoglobin 13.4 13.0 - 17.0 g/dL   HCT 38.9 (L) 39.0 - 52.0 %   MCV 93.1 78.0 - 100.0 fL   MCH 32.1 26.0 - 34.0 pg   MCHC 34.4 30.0 - 36.0 g/dL   RDW 12.7 11.5 - 15.5 %   Platelets 312 150 - 400 K/uL   Neutrophils Relative % 78 (H) 43 - 77 %   Lymphocytes Relative 12 12 - 46 %   Monocytes Relative 9 3 - 12 %   Eosinophils Relative 1 0 - 5 %   Basophils Relative 0 0 - 1 %   Neutro Abs 6.4 1.7 - 7.7 K/uL   Lymphs Abs 1.0 0.7 - 4.0 K/uL   Monocytes Absolute 0.7 0.1 - 1.0 K/uL   Eosinophils Absolute 0.1 0.0 - 0.7 K/uL   Basophils Absolute 0.0 0.0 - 0.1 K/uL   Smear Review MORPHOLOGY UNREMARKABLE     Imaging / Studies: No results found.  Medications / Allergies:  Scheduled Meds: . heparin subcutaneous  5,000 Units Subcutaneous 3 times per day  . piperacillin-tazobactam (ZOSYN)  IV  3.375 g Intravenous Q8H   Continuous Infusions: . 0.45 % NaCl with KCl 20 mEq / L 50 mL/hr (08/08/14 0556)   PRN Meds:.morphine injection, ondansetron **OR** ondansetron (ZOFRAN) IV, oxyCODONE-acetaminophen, sodium  chloride  Antibiotics: Anti-infectives    Start     Dose/Rate Route Frequency Ordered Stop   08/04/14 1200  piperacillin-tazobactam (ZOSYN) IVPB 3.375 g     3.375 g 12.5 mL/hr over 240 Minutes Intravenous Every 8 hours 08/04/14 0941     08/04/14 0315  piperacillin-tazobactam (ZOSYN) IVPB 3.375 g     3.375 g 12.5 mL/hr over 240 Minutes Intravenous  Once 08/04/14 6222 08/04/14 0719        Assessment/Plan Perforated sigmoid colon, secondary to diverticulitis. Extensive intraabdominal contamination. POD#4 exploratory laparotomy, sigmoid colectomy end colostomy Hartmann's pouch ---Dr. Lucia Gaskins 08/04/14 -await bowel function -WOC  -BD wet to dry dressing changes  ID- zosyn D#4/7 for peritonitis  VTE prophylaxis-SCD/heparin FEN-allow for clears, DC PCA, add PO pain meds.  I VF until PO is adequate.  Dispo-continue inpatient    Erby Pian, [facility] Pager [phone]) For consults and floor pages call [phone](7A-4:30P)  08/08/2014 8:10 AM

## 2014-08-09 LAB — ANAEROBIC CULTURE

## 2014-08-09 MED ORDER — DIPHENHYDRAMINE HCL 25 MG PO CAPS
25.0000 mg | ORAL_CAPSULE | Freq: Four times a day (QID) | ORAL | Status: DC | PRN
  Administered 2014-08-09: 25 mg via ORAL
  Filled 2014-08-09: qty 1

## 2014-08-09 NOTE — Care Management Note (Signed)
Case Management Note  Patient Details  Name: Travis Larson MRN: [id number] Date of Birth: 1960/06/20  Subjective/Objective:           POD#5 exploratory laparotomy, sigmoid colectomy end colostomy Hartmann's pouch          Action/Plan: Discharge planning  Expected Discharge Date:   (unknown)    08-10-14           Expected Discharge Plan:  Home w Home Health Services  In-House Referral:  NA  Discharge planning Services  CM Consult  Post Acute Care Choice:  Home Health Choice offered to:  NA, Patient  DME Arranged:    DME Agency:     HH Arranged:  RN, Disease Management HH Agency:  [facility]  Status of Service:  Completed, signed off  Medicare Important Message Given:    Date Medicare IM Given:    Medicare IM give by:    Date Additional Medicare IM Given:    Additional Medicare Important Message give by:     If discussed at Long Length of Stay Meetings, dates discussed:    Additional Comments: After confirming insurance coverage, discussed HH options with patient and spouse. No preference of agency, benefits check for preferred providers. [facility] notified of HH needs, will follow.   Alexis Goodelleele, Elantra Caprara K, RN 08/09/2014, 1:55 PM

## 2014-08-09 NOTE — Consult Note (Signed)
WOC ostomy follow up Stoma type/location:  LLQ, end colostomy Stomal assessment/size: little larger than 1 3/4", budded, os at center.  Black but moist and functioning  Peristomal assessment: intact but some purple discolored skin around stoma.  Discussed appearance of stoma with wife and patient, that stomal sloughing (like a scab) is most likely. Stoma may change appearance and size after this.  Will need to measure stoma weekly for changes. Notified [facility] of same.  Treatment options for stomal/peristomal skin: none Output liquid, dark green  Ostomy pouching: 2pc. 2 1/4" pouching system used.  Education provided:  Met with patient and his wife.  WIFE will not be able to assist with care, she is covering her face with pouch change and pointing at stoma and making noises.  Pt wants to be independent with care.  I have demonstrated pouch emptying, and pouch change to him.  Pattern left in the room and extra supplies.  Discussed diet and lifestyle with ostomy, has educational booklet in the room.  Instructed patient to work with nursing staff to empty this afternoon so he would be comfortable with empty and cleaning spout.   Enrolled patient in [facility] Discharge program: Yes Would recommend [facility] for continued teaching with the patient and for support with ordering supplies.   WOC will follow along with you for ostomy teaching and support Jerik Falletta Grainola RN,CWOCN  [phone]

## 2014-08-09 NOTE — Progress Notes (Addendum)
Patient ID: Travis Larson, male   DOB: Jul 17, 1960, 54 y.o.   MRN: [id number]     [facility]      [address]   [place], [place] [id number]    Phone: [phone] FAX: [phone]     Subjective: Ostomy functioning.  Pain controlled.  VSS.  Afebrile.  Walking.    Objective:  Vital signs:  Filed Vitals:   08/08/14 0557 08/08/14 1400 08/08/14 2208 08/09/14 0524  BP:  138/85 150/85 147/90  Pulse:  95 91 90  Temp:  98.2 F (36.8 C) 98.1 F (36.7 C) 97.5 F (36.4 C)  TempSrc:  Oral Oral Axillary  Resp: 15 15 16 18   Height:      Weight:      SpO2: 98% 97% 93% 99%    Last BM Date:  (colostomy)  Intake/Output   Yesterday:  05/10 0701 - 05/11 0700 In: 1403.3 [I.V.:1203.3; IV Piggyback:200] Out: 650 [Urine:300; Stool:350] This shift:  Total I/O In: -  Out: 200 [Urine:200]   Physical Exam: General: Pt awake/alert/oriented x4 in no acute distress Chest: coarse lower lobes.  No chest wall pain w good excursion CV:  Pulses intact.  Regular rhythm MS: Normal AROM mjr joints.  No obvious deformity Abdomen: Soft.  Nondistended.  Appropriately tender.  Midline wound is c/d/i.  Ostomy is functioning, stoma is purple.   No evidence of peritonitis.  No incarcerated hernias. Ext:  SCDs BLE.  No mjr edema.  No cyanosis Skin: No petechiae / purpura   Problem List:   Active Problems:   Perforated diverticulum of large intestine   Perforated sigmoid colon    Results:   Labs: Results for orders placed or performed during the hospital encounter of 08/04/14 (from the past 48 hour(s))  CBC with Differential/Platelet     Status: Abnormal   Collection Time: 08/08/14  4:36 AM  Result Value Ref Range   WBC 8.2 4.0 - 10.5 K/uL   RBC 4.18 (L) 4.22 - 5.81 MIL/uL   Hemoglobin 13.4 13.0 - 17.0 g/dL   HCT 01.038.9 (L) 27.239.0 - 53.652.0 %   MCV 93.1 78.0 - 100.0 fL   MCH 32.1 26.0 - 34.0 pg   MCHC 34.4 30.0 - 36.0 g/dL   RDW 64.412.7 03.411.5 - 74.215.5 %   Platelets 312 150 - 400 K/uL   Neutrophils Relative % 78 (H) 43 - 77 %   Lymphocytes Relative 12 12 - 46 %   Monocytes Relative 9 3 - 12 %   Eosinophils Relative 1 0 - 5 %   Basophils Relative 0 0 - 1 %   Neutro Abs 6.4 1.7 - 7.7 K/uL   Lymphs Abs 1.0 0.7 - 4.0 K/uL   Monocytes Absolute 0.7 0.1 - 1.0 K/uL   Eosinophils Absolute 0.1 0.0 - 0.7 K/uL   Basophils Absolute 0.0 0.0 - 0.1 K/uL   Smear Review MORPHOLOGY UNREMARKABLE     Imaging / Studies: No results found.  Medications / Allergies:  Scheduled Meds: . heparin subcutaneous  5,000 Units Subcutaneous 3 times per day  . piperacillin-tazobactam (ZOSYN)  IV  3.375 g Intravenous Q8H   Continuous Infusions: . 0.45 % NaCl with KCl 20 mEq / L 50 mL/hr at 08/09/14 0600   PRN Meds:.morphine injection, ondansetron **OR** ondansetron (ZOFRAN) IV, oxyCODONE-acetaminophen, sodium chloride  Antibiotics: Anti-infectives    Start     Dose/Rate Route Frequency Ordered Stop   08/04/14 1200  piperacillin-tazobactam (ZOSYN) IVPB 3.375 g  3.375 g 12.5 mL/hr over 240 Minutes Intravenous Every 8 hours 08/04/14 0941     08/04/14 0315  piperacillin-tazobactam (ZOSYN) IVPB 3.375 g     3.375 g 12.5 mL/hr over 240 Minutes Intravenous  Once 08/04/14 [id number] 08/04/14 0719      Assessment/Plan Perforated sigmoid colon, secondary to diverticulitis. Extensive intraabdominal contamination. POD#5 exploratory laparotomy, sigmoid colectomy end colostomy Hartmann's pouch ---Dr. Ezzard StandingNewman 08/04/14 -WOC  -BD wet to dry dressing changes  -monitor stoma(purple) ID- zosyn D#5/7 for peritonitis  VTE prophylaxis-SCD/heparin FEN-advance to fulls, soft later today, on PO pain meds. Ostomy functioning. Tobacco use-strongly encouraged cessation  Dispo-DC tomorrow with HH   Ashok NorrisEmina Petra Dumler, ANP-BC [facility] Pager [phone](7A-4:30P)   08/09/2014 8:37 AM   RN signed my note, which is incorrect.  Addendum added in order to have Dr. Floy SabinaMartin  cosign

## 2014-08-10 MED ORDER — AMOXICILLIN-POT CLAVULANATE 875-125 MG PO TABS: 1.0000 | ORAL_TABLET | Freq: Two times a day (BID) | ORAL | Status: DC

## 2014-08-10 MED ORDER — SACCHAROMYCES BOULARDII 250 MG PO CAPS: 250.0000 mg | ORAL_CAPSULE | Freq: Two times a day (BID) | ORAL | Status: DC

## 2014-08-10 MED ORDER — OXYCODONE-ACETAMINOPHEN 5-325 MG PO TABS: 1.0000 | ORAL_TABLET | ORAL | Status: DC | PRN

## 2014-08-10 NOTE — Consult Note (Signed)
WOC ostomy consult note Stoma type/location: LLQ End colostomy.  Midline surgical incision.   Stomal assessment/size: Pouch was changed yesterday.  Intact. Stoma remains black but functional.  Liquid green stool present in pouch.  Patient anticipating discharge today.  Peristomal assessment:  Treatment options for stomal/peristomal skin:   Ostomy pouching: 2pc. with barrier rings.   Education provided: Patient performed emptying today.  States he did this last night independently and feels comfortable with it.  Discussed self care, including showering (once cleared to shower) and activity levels.  Wife present in room, discussed intimacy, and option to change pouch just before intimate moments to insure of seal and relieve anxiety regarding leakage.   [facility]  Supplies in a bag and patient knows to take these home.   Will not follow at this time.  Please re-consult if needed.  Maple HudsonKaren Joelee Snoke RN BSN CWON Pager [phone]

## 2014-08-10 NOTE — Progress Notes (Signed)
RIJ CVC d/c'ed per order.  Site cleaned with CHG wipes, covered with vaseline guaze and dry 4x4.   Site slightly red with small amount drainage present.  No ADN.  Pt verbalizes understanding of site and dressing care.

## 2014-08-10 NOTE — Discharge Summary (Signed)
Physician Discharge Summary  Travis Larson ZOX:[id number] DOB: [birth year] DOA: 08/04/2014  PCP: Catalina PizzaHALL, ZACH, MD  Consultation: WOC  Admit date: 08/04/2014 Discharge date: 08/10/2014  Recommendations for Outpatient Follow-up:   Follow-up Information    Follow up with [facility].   Why:  Nurse for wound and ostomy care   Contact information:   [address] [place] [address] [phone]       [facility]   Specialty:  General Surgery   Why:  2-3 weeks for a post op check   Contact information:   [address] [place] [address] [phone]      Discharge Diagnoses:  1. Perforated sigmoid colon secondary to diverticulitis 2. Exploratory laparotomy, sigmoid colectomy end colostomy hartmann's pouch   Surgical Procedure: exploratory laparotomy, sigmoid colectomy end colostomy Hartmann's pouch ---Dr. Ezzard StandingNewman 08/04/14  Discharge Condition: stable Disposition: home  Diet recommendation: regular  Filed Weights   08/04/14 0906 08/05/14 0400  Weight: 96.6 kg (212 lb 15.4 oz) 92.4 kg (203 lb 11.3 oz)     Filed Vitals:   08/10/14 0519  BP: 131/64  Pulse: 92  Temp: 98.7 F (37.1 C)  Resp: 16     Hospital Course:  [facility].  CT of abdomen and pelvis showed acute diverticulitis of the mid sigmoid with colon perforation and widespread peritoneal contamination.  He underwent urgent surgery listed above.  Post operatively, he was given lasix for fluid overload.  He was kept on zosyn post op.  WOC was consulted for ostomy teaching.  Stoma was purple, but functioned.  Diet was advanced as ostomy functioned.  He remained stable and was mobilized.  On POD#6 the patient was tolerating a diet, pain well controlled, afebrile, VSS.  He was therefore felt stable for discharge home with Augmentin x4 days to complete a 10 day course.  Medication risks, benefits and therapeutic alternatives were  reviewed with the patient.  She/he verbalizes understanding. Home health was established.  He knows to call with questions or concerns.     Physical Exam: General: Pt awake/alert/oriented x4 in no acute distress Chest: coarse lower lobes. No chest wall pain w good excursion CV: Pulses intact. Regular rhythm MS: Normal AROM mjr joints. No obvious deformity Abdomen: Soft. Nondistended. Appropriately tender. Midline wound is is clean, fascia is intact. Ostomy is functioning, stoma is purple. No evidence of peritonitis. No incarcerated hernias. Ext: SCDs BLE. No mjr edema. No cyanosis Skin: No petechiae / purpura  Discharge Instructions     Medication List    STOP taking these medications        predniSONE 20 MG tablet  Commonly known as:  DELTASONE      TAKE these medications        amoxicillin-clavulanate 875-125 MG per tablet  Commonly known as:  AUGMENTIN  Take 1 tablet by mouth 2 (two) times daily.     oxyCODONE-acetaminophen 5-325 MG per tablet  Commonly known as:  PERCOCET/ROXICET  Take 1-2 tablets by mouth every 4 (four) hours as needed for moderate pain or severe pain.     saccharomyces boulardii 250 MG capsule  Commonly known as:  FLORASTOR  Take 1 capsule (250 mg total) by mouth 2 (two) times daily.     tamsulosin 0.4 MG Caps capsule  Commonly known as:  FLOMAX  Take 1 capsule by mouth daily.  Follow-up Information    Follow up with [facility].   Why:  Nurse for wound and ostomy care   Contact information:   [address] [place] [address] [phone]       Follow up with [facility], [facility].   Specialty:  General Surgery   Why:  2-3 weeks for a post op check   Contact information:   [address] [place] [address] [phone]        The results of significant diagnostics from this hospitalization (including imaging, microbiology, ancillary and laboratory) are listed below for  reference.    Significant Diagnostic Studies: Ct Abdomen Pelvis W Contrast  08/04/2014   CLINICAL DATA:  Severe lower abdominal pain  EXAM: CT ABDOMEN AND PELVIS WITH CONTRAST  TECHNIQUE: Multidetector CT imaging of the abdomen and pelvis was performed using the standard protocol following bolus administration of intravenous contrast.  CONTRAST:  50mL OMNIPAQUE IOHEXOL 300 MG/ML SOLN, OMNIPAQUE IOHEXOL 300 MG/ML SOLN  COMPARISON:  None.  FINDINGS: There is acute inflammatory change surrounding a diverticulum of the mid sigmoid colon. There are widely scattered bubbles of extraluminal air around the involved portion of the sigmoid and also extending more widely throughout the peritoneum. There is no defined abscess. There is a small volume free fluid in the dependent pelvis and there also is a small volume fluid around the liver and around the spleen.  There are normal appearances of the liver, spleen, pancreas, adrenals and kidneys.  Multiple calculi are present in the gallbladder, measuring up to 11 mm. There is no bile duct dilatation.  The abdominal aorta is normal in caliber with mild atherosclerotic calcification.  There is no significant abnormality in the lower chest.  IMPRESSION: *Acute diverticulitis of the mid sigmoid colon with perforation and widespread peritoneal contamination. There is small volume free air scattered throughout the peritoneum. *Perihepatic and perisplenic fluid, small volume. There also is a small volume fluid collected dependently in the pelvis. *Cholelithiasis   Electronically Signed   By: Ellery Plunk M.D.   On: 08/04/2014 03:26   Dg Chest Port 1 View  08/05/2014   CLINICAL DATA:  Hypoxia  EXAM: PORTABLE CHEST - 1 VIEW  COMPARISON:  the previous day's study  FINDINGS: Nasogastric tube and right IJ central line are stable in position. Coarse interstitial and airspace opacities, slightly increased on the left since previous exam. Partial improvement in the  consolidation/atelectasis previously seen at the right lung base. Heart size remains upper limits normal for technique. Atheromatous aorta. No effusion. Cervical fixation hardware partially seen.  IMPRESSION: 1. Some redistribution of asymmetric infiltrates or edema since previous day's exam, without significant overall change in severity.   Electronically Signed   By: Corlis Leak M.D.   On: 08/05/2014 09:15   Dg Chest Port 1 View  08/04/2014   CLINICAL DATA:  Central placement .  EXAM: PORTABLE CHEST - 1 VIEW  COMPARISON:  None.  FINDINGS: Right IJ line noted with tip projected over cavoatrial junction. NG tube noted with tip below left hemidiaphragm. Cardiomegaly with diffuse bilateral pulmonary interstitial prominence consistent with congestive heart failure. Bilateral pneumonitis cannot be excluded. Basilar atelectasis. No acute bony abnormality.  IMPRESSION: 1. Right IJ line noted with tip projected over cavoatrial junction. NG tube noted with tip below left hemidiaphragm. 2. Cardiomegaly with diffuse pulmonary interstitial prominence consistent congestive heart failure. Pneumonitis cannot be excluded. 3. Low lung volumes with bibasilar subsegmental atelectasis.  Electronically Signed   By: Maisie Fus  Register   On: 08/04/2014 08:20   Dg Abd Acute W/chest  08/04/2014   CLINICAL DATA:  Increasing inguinal pain for the past 2 days. Nausea for  EXAM: DG ABDOMEN ACUTE W/ 1V CHEST  COMPARISON:  None.  FINDINGS: There are slightly prominent small bowel loops in the left mid abdomen with a suggestion of thumbprinting. This suggests mural edema. Air is scattered throughout the colon to the rectum. There is no free intraperitoneal air. No urinary or biliary calculi are evident.  IMPRESSION: Mildly prominent mid abdominal small bowel loops with a suggestion of mural edema. This is more likely nonobstructive.   Electronically Signed   By: Ellery Plunk M.D.   On: 08/04/2014 02:02    Microbiology: Recent Results  (from the past 240 hour(s))  Anaerobic culture     Status: None   Collection Time: 08/04/14  5:43 AM  Result Value Ref Range Status   Specimen Description PERITONEAL  Final   Special Requests NONE  Final   Gram Stain   Final    FEW WBC PRESENT,BOTH PMN AND MONONUCLEAR MODERATE GRAM POSITIVE RODS FEW GRAM NEGATIVE RODS Performed at [facility]    Culture   Final    MULTIPLE ORGANISMS PRESENT, NONE PREDOMINANT Performed at [facility]    Report Status 08/09/2014 FINAL  Final  Body fluid culture     Status: None   Collection Time: 08/04/14  5:43 AM  Result Value Ref Range Status   Specimen Description PERITONEAL  Final   Special Requests NONE  Final   Gram Stain   Final    FEW WBC PRESENT,BOTH PMN AND MONONUCLEAR MODERATE GRAM POSITIVE RODS FEW GRAM NEGATIVE RODS Gram Stain Report Called to,Read Back By and Verified With: Gram Stain Report Called to,Read Back By and Verified With: [facility] MORGAN AT 1415 ON [id number] BY WEBSP Performed at [facility]    Culture   Final    MULTIPLE ORGANISMS PRESENT, NONE PREDOMINANT Note: NO STAPHYLOCOCCUS AUREUS ISOLATED NO GROUP A STREP (S.PYOGENES) ISOLATED Performed at [facility]    Report Status 08/07/2014 FINAL  Final     Labs: Basic Metabolic Panel:  Recent Labs Lab 08/04/14 0146 08/05/14 0420 08/07/14 0550  NA 140 136 134*  K 3.5 4.1 3.5  CL 99* 108 101  CO2  --  24 25  GLUCOSE 182* 99 101*  BUN CREATININE 0.90 0.84 0.69  CALCIUM  --  7.4* 7.6*   Liver Function Tests:  Recent Labs Lab 08/04/14 0142  AST 19  ALT 23  ALKPHOS 68  BILITOT 0.7  PROT 6.9  ALBUMIN 4.0   No results for input(s): LIPASE, AMYLASE in the last 168 hours. No results for input(s): AMMONIA in the last 168 hours. CBC:  Recent Labs Lab 08/04/14 0142 08/04/14 0146 08/05/14 0420 08/07/14 0550 08/08/14 0436  WBC 11.9*  --  12.2* 10.4 8.2  NEUTROABS 10.4*  --   --   --  6.4  HGB 15.9 16.3 13.0  12.9* 13.4  HCT 44.7 48.0 37.3* 37.0* 38.9*  MCV 93.1  --  94.4 93.2 93.1  PLT 251  --  203 264 312   Cardiac Enzymes: No results for input(s): CKTOTAL, CKMB, CKMBINDEX, TROPONINI in the last 168 hours. BNP: BNP (last 3 results) No results for input(s): BNP in the last 8760 hours.  ProBNP (last 3 results) No results for input(s): PROBNP in the last  8760 hours.  CBG:  Recent Labs Lab 08/04/14 2018 08/05/14 0020 08/05/14 0447  GLUCAP 83 94 93    Active Problems:   Perforated diverticulum of large intestine   Perforated sigmoid colon   Time coordinating discharge: <30 mins  Signed:  Edward Trevino, ANP-BC

## 2014-08-10 NOTE — Discharge Instructions (Signed)

## 2014-08-14 ENCOUNTER — Encounter (HOSPITAL_COMMUNITY): Payer: Self-pay | Admitting: *Deleted

## 2014-08-14 ENCOUNTER — Emergency Department (HOSPITAL_COMMUNITY): Payer: BLUE CROSS/BLUE SHIELD

## 2014-08-14 ENCOUNTER — Emergency Department (HOSPITAL_COMMUNITY)
Admission: EM | Admit: 2014-08-14 | Discharge: 2014-08-14 | Disposition: A | Discharge status: Home / Self Care | Payer: BLUE CROSS/BLUE SHIELD | Attending: Emergency Medicine | Admitting: Emergency Medicine

## 2014-08-14 DIAGNOSIS — Z79899 Other long term (current) drug therapy: Secondary | ICD-10-CM | POA: Insufficient documentation

## 2014-08-14 DIAGNOSIS — Z792 Long term (current) use of antibiotics: Secondary | ICD-10-CM | POA: Diagnosis not present

## 2014-08-14 DIAGNOSIS — J9811 Atelectasis: Secondary | ICD-10-CM | POA: Diagnosis not present

## 2014-08-14 DIAGNOSIS — R091 Pleurisy: Secondary | ICD-10-CM

## 2014-08-14 DIAGNOSIS — R079 Chest pain, unspecified: Secondary | ICD-10-CM | POA: Diagnosis present

## 2014-08-14 DIAGNOSIS — Z720 Tobacco use: Secondary | ICD-10-CM | POA: Diagnosis not present

## 2014-08-14 LAB — I-STAT CHEM 8, ED
BUN: 11 mg/dL (ref 6–20)
Calcium, Ion: 1.11 mmol/L — ABNORMAL LOW (ref 1.12–1.23)
Chloride: 102 mmol/L (ref 101–111)
Creatinine, Ser: 0.8 mg/dL (ref 0.61–1.24)
Glucose, Bld: 109 mg/dL — ABNORMAL HIGH (ref 65–99)
HCT: 39 % (ref 39.0–52.0)
Hemoglobin: 13.3 g/dL (ref 13.0–17.0)
Potassium: 3.7 mmol/L (ref 3.5–5.1)
Sodium: 138 mmol/L (ref 135–145)
TCO2: 21 mmol/L (ref 0–100)

## 2014-08-14 MED ORDER — IOHEXOL 350 MG/ML SOLN
100.0000 mL | Freq: Once | INTRAVENOUS | Status: AC | PRN
  Administered 2014-08-14: 100 mL via INTRAVENOUS

## 2014-08-14 NOTE — ED Notes (Signed)
Pt c/o severe left sided chest pain that started this am; pt states the pain is intermittent; pt recently had abdominal surgery for ruptured colon and now has a colostomy bag and was released from the hospital x 4 days ago

## 2014-08-14 NOTE — ED Notes (Signed)
Pt alert & oriented x4, stable gait. Patient given discharge instructions, paperwork & prescription(s). Patient  instructed to stop at the registration desk to finish any additional paperwork. Patient verbalized understanding. Pt left department w/ no further questions. 

## 2014-08-14 NOTE — ED Provider Notes (Signed)
CSN: [id number]     Arrival date & time 08/14/14  2049 History  This chart was scribed for Nelva Nayobert Stein Windhorst, MD by Buckner MaltaJason Robinson, ED Scribe. This patient was seen in room APA02/APA02 and the patient's care was started at 9:20 PM.     Chief Complaint  Patient presents with  . Chest Pain   The history is provided by the patient. No language interpreter was used.    HPI Comments: Travis Larson is a 54 y.o. male with PMHx of diverticulitis who presents to the Emergency Department complaining of intermittent, gradually worsening, sharp, left-sided CP that started earlier today. Pt states pain becomes worse during inhalation and coughing. The patient was admitted to the hospital on 5/6 after an exploratory laparotomy, sigmoid colectomy and colostomy. He was discharged 4 days ago, but has a home health nurse daily. The patient admits to smoking for the past 20 years. He denies CP with palpation as an associated symptom.  Past Medical History  Diagnosis Date  . Seizures    Past Surgical History  Procedure Laterality Date  . Fracture surgery      neck  . Laparotomy N/A 08/04/2014    Procedure: EXPLORATORY LAPAROTOMY, SIGMOID COLECTOMY END COLOSTOMY HARTMAN'S PATCH;  Surgeon: Ovidio Kinavid Newman, MD;  Location: [facility];  Service: General;  Laterality: N/A;   History reviewed. No pertinent family history. History  Substance Use Topics  . Smoking status: Current Every Day Smoker  . Smokeless tobacco: Not on file  . Alcohol Use: No    Review of Systems  Respiratory: Positive for cough.   Cardiovascular: Positive for chest pain.  All other systems reviewed and are negative.  Allergies  Review of patient's allergies indicates no known allergies.  Home Medications   Prior to Admission medications   Medication Sig Start Date End Date Taking? Authorizing Provider  amoxicillin-clavulanate (AUGMENTIN) 875-125 MG per tablet Take 1 tablet by mouth 2 (two) times daily. 08/10/14  Yes Emina Riebock, NP   oxyCODONE-acetaminophen (PERCOCET/ROXICET) 5-325 MG per tablet Take 1-2 tablets by mouth every 4 (four) hours as needed for moderate pain or severe pain. 08/10/14  Yes Emina Riebock, NP  saccharomyces boulardii (FLORASTOR) 250 MG capsule Take 1 capsule (250 mg total) by mouth 2 (two) times daily. 08/10/14  Yes Emina Riebock, NP   BP 120/80 mmHg  Pulse 74  Temp(Src) 98.8 F (37.1 C) (Oral)  Resp 11  Ht 5\' 10"  (1.778 m)  Wt 170 lb (77.111 kg)  BMI 24.39 kg/m2  SpO2 94% Physical Exam  Constitutional: He is oriented to person, place, and time. He appears well-developed and well-nourished. No distress.  HENT:  Head: Normocephalic and atraumatic.  Eyes: Pupils are equal, round, and reactive to light.  Neck: Normal range of motion.  Cardiovascular: Normal rate and intact distal pulses.   Pulmonary/Chest: No respiratory distress. He has no wheezes. He has no rales.  Abdominal: Normal appearance. He exhibits no distension.    Musculoskeletal: Normal range of motion.  Neurological: He is alert and oriented to person, place, and time. No cranial nerve deficit.  Skin: Skin is warm and dry. No rash noted.  Psychiatric: He has a normal mood and affect. His behavior is normal.  Nursing note and vitals reviewed.   ED Course  Procedures  DIAGNOSTIC STUDIES: Oxygen Saturation is 98% on RA, Normal by my interpretation.    COORDINATION OF CARE: 9:30 PM Discussed treatment plan which includes chest x-ray and lab work. Pt agreed to plan.  Results for orders placed or performed during the hospital encounter of 08/14/14  I-stat chem 8, ed  Result Value Ref Range   Sodium 138 135 - 145 mmol/L   Potassium 3.7 3.5 - 5.1 mmol/L   Chloride 102 101 - 111 mmol/L   BUN 11 6 - 20 mg/dL   Creatinine, Ser 6.960.80 0.61 - 1.24 mg/dL   Glucose, Bld 295109 (H) 65 - 99 mg/dL   Calcium, Ion 2.841.11 (L) 1.12 - 1.23 mmol/L   TCO2 21 0 - 100 mmol/L   Hemoglobin 13.3 13.0 - 17.0 g/dL   HCT 13.239.0 44.039.0 - 10.252.0 %   Ct  Angio Chest Pe W/cm &/or Wo Cm  08/14/2014   CLINICAL DATA:  LEFT-sided chest pain beginning this morning, worse with inspiration. Recent abdominal surgery, resulting and colostomy. Twenty year history of smoking.  EXAM: CT ANGIOGRAPHY CHEST WITH CONTRAST  TECHNIQUE: Multidetector CT imaging of the chest was performed using the standard protocol during bolus administration of intravenous contrast. Multiplanar CT image reconstructions and MIPs were obtained to evaluate the vascular anatomy.  CONTRAST:  100mL OMNIPAQUE IOHEXOL 350 MG/ML SOLN  COMPARISON:  Chest radiograph Aug 14, 2014  FINDINGS: Mild motion degraded examination.  PULMONARY ARTERY: Adequate contrast opacification of the pulmonary artery's. Main pulmonary artery is not enlarged. No pulmonary arterial filling defects to the level of the subsegmental branches.  MEDIASTINUM: Heart appears mildly enlarged, no right heart strain. Mild pulmonary vascular congestion No pericardial fluid collections. Thoracic aorta is normal course and caliber, mild calcific atherosclerosis. Borderline lymphadenopathy, 9 mm short axis aortopulmonary window lymph node, smaller pretracheal lymph nodes.  LUNGS: Tracheobronchial tree is patent, no pneumothorax. Bilateral lower lobe enhancing discoid atelectasis without pleural effusion or focal consolidation. Tracheobronchial tree is patent. No pneumothorax.  SOFT TISSUES AND OSSEOUS STRUCTURES: Included view of the abdomen is unremarkable. Visualized soft tissues and included osseous structures appear normal.  Review of the MIP images confirms the above findings.  IMPRESSION: No acute pulmonary embolism on this mildly motion degraded examination.  Mild cardiomegaly and pulmonary vascular congestion. Bilateral lower lobe atelectasis.  Borderline mediastinal lymphadenopathy is likely reactive.   Electronically Signed   By: Awilda Metroourtnay  Bloomer   On: 08/14/2014 23:02   Ct Abdomen Pelvis W Contrast  08/04/2014   CLINICAL DATA:  Severe  lower abdominal pain  EXAM: CT ABDOMEN AND PELVIS WITH CONTRAST  TECHNIQUE: Multidetector CT imaging of the abdomen and pelvis was performed using the standard protocol following bolus administration of intravenous contrast.  CONTRAST:  50mL OMNIPAQUE IOHEXOL 300 MG/ML SOLN, 100mL OMNIPAQUE IOHEXOL 300 MG/ML SOLN  COMPARISON:  None.  FINDINGS: There is acute inflammatory change surrounding a diverticulum of the mid sigmoid colon. There are widely scattered bubbles of extraluminal air around the involved portion of the sigmoid and also extending more widely throughout the peritoneum. There is no defined abscess. There is a small volume free fluid in the dependent pelvis and there also is a small volume fluid around the liver and around the spleen.  There are normal appearances of the liver, spleen, pancreas, adrenals and kidneys.  Multiple calculi are present in the gallbladder, measuring up to 11 mm. There is no bile duct dilatation.  The abdominal aorta is normal in caliber with mild atherosclerotic calcification.  There is no significant abnormality in the lower chest.  IMPRESSION: *Acute diverticulitis of the mid sigmoid colon with perforation and widespread peritoneal contamination. There is small volume free air scattered throughout the peritoneum. *Perihepatic and perisplenic  fluid, small volume. There also is a small volume fluid collected dependently in the pelvis. *Cholelithiasis   Electronically Signed   By: Ellery Plunk M.D.   On: 08/04/2014 03:26   Dg Chest Portable 1 View  08/14/2014   CLINICAL DATA:  Sharp intermittent chest pain worsening over last few hours with inspiration and cough. Status post exploratory laparotomy Aug 04, 2014.  EXAM: PORTABLE CHEST - 1 VIEW  COMPARISON:  Chest radiograph Aug 05, 2014  FINDINGS: Cardiomediastinal silhouette is unremarkable for this low inspiratory portable examination with crowded vasculature markings. The lungs are clear without pleural effusions or focal  consolidations. Mildly elevated RIGHT hemidiaphragm. Trachea projects midline and there is no pneumothorax. Included soft tissue planes and osseous structures are non-suspicious. Partially imaged ACDF.  IMPRESSION: No acute cardiopulmonary process for this low inspiratory portable examination.   Electronically Signed   By: Awilda Metro   On: 08/14/2014 22:11       Date: 08/17/2014  Rate:76  Rhythm: normal sinus rhythm  QRS Axis: normal  Intervals: normal  ST/T Wave abnormalities: nonspecific T waves  Conduction Disutrbances: none  Narrative Interpretation: unremarkable           MDM   Final diagnoses:  Pleurisy  Atelectasis   I personally performed the services described in this documentation, which was scribed in my presence. The recorded information has been reviewed and considered.    Nelva Nay, MD 08/17/14 1010

## 2014-08-14 NOTE — ED Notes (Signed)
   08/14/14 2130  Chest Pain Assessment  Occurrence Today  Chronicity New  Chest Pain Location Left chest  Pain Descriptors / Indicators Sharp  Associated Symptoms Shortness of breath  Risk Factors Smoking history  Pt reports left sided chest pain to the lower rib cage that comes & goes. Pt says worse when he takes a deep breath.

## 2014-08-14 NOTE — Discharge Instructions (Signed)
Pleurisy Pleurisy is an inflammation and swelling of the lining of the lungs (pleura). Because of this inflammation, it hurts to breathe. It can be aggravated by coughing, laughing, or deep breathing. Pleurisy is often caused by an underlying infection or disease.  HOME CARE INSTRUCTIONS  Monitor your pleurisy for any changes. The following actions may help to alleviate any discomfort you are experiencing:  Medicine may help with pain. Only take over-the-counter or prescription medicines for pain, discomfort, or fever as directed by your health care provider.  Only take antibiotic medicine as directed. Make sure to finish it even if you start to feel better. SEEK MEDICAL CARE IF:   Your pain is not controlled with medicine or is increasing.  You have an increase in pus-like (purulent) secretions brought up with coughing. SEEK IMMEDIATE MEDICAL CARE IF:   You have blue or dark lips, fingernails, or toenails.  You are coughing up blood.  You have increased difficulty breathing.  You have continuing pain unrelieved by medicine or pain lasting more than 1 week.  You have pain that radiates into your neck, arms, or jaw.  You develop increased shortness of breath or wheezing.  You develop a fever, rash, vomiting, fainting, or other serious symptoms. MAKE SURE YOU:  Understand these instructions.   Will watch your condition.   Will get help right away if you are not doing well or get worse.  Document Released: 03/17/2005 Document Revised: 11/17/2012 Document Reviewed: 08/29/2012 [facility] Patient Information 2015 [place], [place]. This information is not intended to replace advice given to you by your health care provider. Make sure you discuss any questions you have with your health care provider.  Atelectasis Atelectasis is a collapse of the small air sacs in the lungs (alveoli). When this occurs, all or part of a lung collapses and becomes airless. It can be caused by various  things and is a common problem after surgery. The severity of atelectasis will vary depending on the size of the area involved and the underlying cause of the condition. CAUSES  There are multiple causes for atelectasis:   Shallow breathing, particularly if there is an injury to your chest wall or abdomen that makes it painful to take a deep breath. This commonly occurs after surgery.  Obstruction of your airways (bronchi or bronchioles). This may be caused by a buildup of mucus (mucus plug), tumors, blood clots (pulmonary embolus), or inhaled foreign bodies. Mucus plugs occur when the lungs do not expand enough to get rid of mucus.  Outside pressure on the lung. This may be caused by tumors, fluid (pleural effusion), or a leakage of air between the lung and rib cage (pneumothorax).   Infections such as pneumonia.  Scarring in lung tissue left over from previous infection or injury.  Some diseases such as cystic fibrosis. SIGNS AND SYMPTOMS  Often, atelectasis will have no symptoms. When symptoms occur, they include:  Shortness of breath.   Bluish color to your nails, lips, or mouth (cyanosis). DIAGNOSIS  Your health care provider may suspect atelectasis based on symptoms and physical findings. A chest X-ray may be done to confirm the diagnosis. More specialized X-ray exams are sometimes required.  TREATMENT  Treatment will depend on the cause of the atelectasis. Treatment may include:  Purposeful coughing to loosen mucus plugs in the lungs.  Chest physiotherapy. This consists of clapping or percussion on the chest over the lungs to further loosen mucus plugs.  Postural drainage techniques. This involves positioning your body  so your head is lower than your chest. HOME CARE INSTRUCTIONS  Practice relaxed deep breathing whenever you are sitting down. A good technique is to take a few relaxed deep breaths each time a commercial comes on if you are watching television.  If you were  given a deep breathing device (such as an incentive spirometer) or a mucus clearance device, use this regularly as directed by your health care provider.  Try to cough several times a day as directed by your health care provider.  Perform any chest physiotherapy or postural drainage techniques as directed by your health care provider. If necessary, have someone (such as a family member) assist you with these techniques.  When lying down, lie on the unaffected side to encourage mucus drainage.  Stay physically active as much as possible. SEEK IMMEDIATE MEDICAL CARE IF:   You develop increasing problems with your breathing.   You develop severe chest pain.   You develop severe coughing, or you cough up blood.   You have a fever or persistent symptoms for more than 2-3 days.   You have a fever and your symptoms suddenly get worse.  MAKE SURE YOU:  Understand these instructions.  Will watch your condition.  Will get help right away if you are not doing well or get worse. Document Released: 03/17/2005 Document Revised: 03/22/2013 Document Reviewed: 09/22/2012 [facility] Patient Information 2015 [place], [place]. This information is not intended to replace advice given to you by your health care provider. Make sure you discuss any questions you have with your health care provider.

## 2014-09-28 ENCOUNTER — Other Ambulatory Visit: Payer: Self-pay | Admitting: Surgery

## 2014-09-28 DIAGNOSIS — K5720 Diverticulitis of large intestine with perforation and abscess without bleeding: Secondary | ICD-10-CM

## 2014-09-28 DIAGNOSIS — K5732 Diverticulitis of large intestine without perforation or abscess without bleeding: Secondary | ICD-10-CM

## 2014-10-04 ENCOUNTER — Other Ambulatory Visit: Payer: BLUE CROSS/BLUE SHIELD

## 2014-10-19 ENCOUNTER — Ambulatory Visit
Admission: RE | Admit: 2014-10-19 | Discharge: 2014-10-19 | Disposition: A | Discharge status: Home / Self Care | Payer: BLUE CROSS/BLUE SHIELD | Source: Ambulatory Visit | Attending: Surgery | Admitting: Surgery

## 2014-10-19 DIAGNOSIS — K5720 Diverticulitis of large intestine with perforation and abscess without bleeding: Secondary | ICD-10-CM

## 2015-01-12 ENCOUNTER — Other Ambulatory Visit: Payer: Self-pay | Admitting: Surgery

## 2015-01-16 NOTE — H&P (Signed)
Travis Larson  Location: [facility] Patient #: [id number] DOB: 08-26-1960 Married / Language: English / Race: White Male   History of Present Illness  The patient is a 54 year old male who presents with diverticulitis.   His PCP is Travis Larson.  He comes by himself.   He had a brother with CP die in early Travis. this has stressed him some.  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 He said that they had trouble accessing the ostomy when they did the BE. He still needs a colonscopy prior to reversing his ostomy.  He is going fishing this weekend Travis Larson?).  I again went over quitting smoking prior to surgery.  History of colostomy: The patient presented on 04 Aug 2014 to the [facility] emergency room with a perforated sigmoid colon. He underwent a sigmoid colectomy with end colostomy on 04 Aug 2014 by Travis Larson. he is doing well. He is still weak, but making progress. They are doing a good job of taking care of his midline wound. I gave him a copy of his path report.  Plan: 1) colonoscopy in October, 2) reverse colostomy in November, 3) see me back prior to colostomy reversal.  Past Medical History: 1. Gallstones 2. Remote history of seizures Last seizure >6 years ago. He does not see a neurologist and is on no meds 3. Broke neck 2 years ago in [place] Treated at [facility] with plate in neck. He said he injured blood vessel in right neck. 4. Smokes 1 ppd  SOCIAL and FAMILY HISTORY: Married. 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   Other Problems Travis Larson, Travis Larson; 12/29/2014 3:17 PM) Other disease, cancer, significant illness Seizure Disorder  Allergies Travis Larson, Travis Larson; 12/29/2014 2:16 PM) No Known Drug Allergies05/20/2016  Medication History Travis Larson, Travis Larson; 12/29/2014 2:17 PM) No Current Medications Medications  Reconciled  Vitals Travis Larson Travis Larson; 12/29/2014 2:17 PM) 12/29/2014 2:17 PM Weight: 180 lb Height: 71in Body Surface Area: 2.02 m Body Mass Index: 25.1 kg/m  Temp.: 98.89F(Temporal)  Pulse: 81 (Regular)  BP: 130/70 (Sitting, Left Arm, Standard)     Physical Exam  General: Red haired WM alert. Continues to do better. HEENT: Normal. Pupils equal.  Neck: Supple. No mass. No thyroid mass.   Lungs: Clear to auscultation and symmetric breath sounds. Heart: RRR. No murmur or rub.  Abdomen: Soft. No mass. Normal bowel sounds.  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 Since we are planning to reverse this in around 6 weeks, it needs nothing else done to it.  Extremities: Good strength and ROM in upper and lower extremities.  Assessment & Plan  1.  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 Amparo Donalson  Impression: Plan:   1) colonoscopy in October  Bowel prep given   2) Schedule colostomy closure in November.   3) I will see one week prior to surgery to give bowel prep and antibiotics.  Current Plans  Follow up 7 to 14 days prior to colon surgery  2. SMOKES (F17.200) Impression: He knows he needs to quit prior to surgery.  3. Gallstones 4 Remote history of seizures Last seizure >6 years ago. He does not see a neurologist and is on no meds 5 Broke neck 2 years ago in [place] Treated at [facility] with plate in neck. He said he injured  blood vessel in right neck.  Travis Larson Travis Larson, Travis Larson, Travis Larson: [phone] Office phone:  [phone]

## 2015-01-17 ENCOUNTER — Ambulatory Visit (HOSPITAL_COMMUNITY)
Admission: RE | Admit: 2015-01-17 | Discharge: 2015-01-17 | Disposition: A | Discharge status: Home / Self Care | Payer: BLUE CROSS/BLUE SHIELD | Source: Ambulatory Visit | Attending: Surgery | Admitting: Surgery

## 2015-01-17 ENCOUNTER — Encounter (HOSPITAL_COMMUNITY): Payer: Self-pay | Admitting: *Deleted

## 2015-01-17 ENCOUNTER — Encounter (HOSPITAL_COMMUNITY)
Admission: RE | Disposition: A | Discharge status: Home / Self Care | Payer: Self-pay | Source: Ambulatory Visit | Attending: Surgery

## 2015-01-17 DIAGNOSIS — Z933 Colostomy status: Secondary | ICD-10-CM | POA: Insufficient documentation

## 2015-01-17 DIAGNOSIS — Z09 Encounter for follow-up examination after completed treatment for conditions other than malignant neoplasm: Secondary | ICD-10-CM | POA: Diagnosis present

## 2015-01-17 DIAGNOSIS — R569 Unspecified convulsions: Secondary | ICD-10-CM | POA: Insufficient documentation

## 2015-01-17 DIAGNOSIS — Z8719 Personal history of other diseases of the digestive system: Secondary | ICD-10-CM | POA: Insufficient documentation

## 2015-01-17 DIAGNOSIS — K8020 Calculus of gallbladder without cholecystitis without obstruction: Secondary | ICD-10-CM | POA: Insufficient documentation

## 2015-01-17 DIAGNOSIS — F17200 Nicotine dependence, unspecified, uncomplicated: Secondary | ICD-10-CM | POA: Insufficient documentation

## 2015-01-17 HISTORY — PX: FLEXIBLE SIGMOIDOSCOPY: SHX5431

## 2015-01-17 SURGERY — SIGMOIDOSCOPY, FLEXIBLE
Anesthesia: Moderate Sedation

## 2015-01-17 MED ORDER — MIDAZOLAM HCL 5 MG/ML IJ SOLN
INTRAMUSCULAR | Status: AC
  Filled 2015-01-17: qty 2

## 2015-01-17 MED ORDER — FENTANYL CITRATE (PF) 100 MCG/2ML IJ SOLN
INTRAMUSCULAR | Status: AC
  Filled 2015-01-17: qty 2

## 2015-01-17 MED ORDER — FENTANYL CITRATE (PF) 100 MCG/2ML IJ SOLN
INTRAMUSCULAR | Status: DC | PRN
  Administered 2015-01-17 (×3): 25 ug via INTRAVENOUS

## 2015-01-17 MED ORDER — MIDAZOLAM HCL 5 MG/5ML IJ SOLN
INTRAMUSCULAR | Status: DC | PRN
  Administered 2015-01-17: 1 mg via INTRAVENOUS
  Administered 2015-01-17 (×2): 2 mg via INTRAVENOUS

## 2015-01-17 MED ORDER — SODIUM CHLORIDE 0.9 % IV SOLN
INTRAVENOUS | Status: DC
  Administered 2015-01-17: 500 mL via INTRAVENOUS

## 2015-01-17 MED ORDER — DIPHENHYDRAMINE HCL 50 MG/ML IJ SOLN
INTRAMUSCULAR | Status: AC
  Filled 2015-01-17: qty 1

## 2015-01-17 NOTE — Discharge Instructions (Signed)
Colonoscopy, Care After °Refer to this sheet in the next few weeks. These instructions provide you with information on caring for yourself after your procedure. Your health care provider may also give you more specific instructions. Your treatment has been planned according to current medical practices, but problems sometimes occur. Call your health care provider if you have any problems or questions after your procedure. °WHAT TO EXPECT AFTER THE PROCEDURE  °After your procedure, it is typical to have the following: °· A small amount of blood in your stool. °· Moderate amounts of gas and mild abdominal cramping or bloating. °HOME CARE INSTRUCTIONS °· Do not drive, operate machinery, or sign important documents for 24 hours. °· You may shower and resume your regular physical activities, but move at a slower pace for the first 24 hours. °· Take frequent rest periods for the first 24 hours. °· Walk around or put a warm pack on your abdomen to help reduce abdominal cramping and bloating. °· Drink enough fluids to keep your urine clear or pale yellow. °· You may resume your normal diet as instructed by your health care provider. Avoid heavy or fried foods that are hard to digest. °· Avoid drinking alcohol for 24 hours or as instructed by your health care provider. °· Only take over-the-counter or prescription medicines as directed by your health care provider. °· If a tissue sample (biopsy) was taken during your procedure: °¨ Do not take aspirin or blood thinners for 7 days, or as instructed by your health care provider. °¨ Do not drink alcohol for 7 days, or as instructed by your health care provider. °¨ Eat soft foods for the first 24 hours. °SEEK MEDICAL CARE IF: °You have persistent spotting of blood in your stool 2-3 days after the procedure. °SEEK IMMEDIATE MEDICAL CARE IF: °· You have more than a small spotting of blood in your stool. °· You pass large blood clots in your stool. °· Your abdomen is swollen  (distended). °· You have nausea or vomiting. °· You have a fever. °· You have increasing abdominal pain that is not relieved with medicine. °  °This information is not intended to replace advice given to you by your health care provider. Make sure you discuss any questions you have with your health care provider. °  °Document Released: 10/30/2003 Document Revised: 01/05/2013 Document Reviewed: 11/22/2012 °Elsevier Interactive Patient Education ©2016 Elsevier Inc. ° °

## 2015-01-17 NOTE — Interval H&P Note (Signed)
History and Physical Interval Note:  01/17/2015 11:29 AM  Travis Larson  has presented today for surgery, with the diagnosis of perforated colon diverticulitis  The various methods of treatment have been discussed with the patient and family.  Wife at bedside.  After consideration of risks, benefits and other options for treatment, the patient has consented to  Procedure(s): COLONOSCOPY (N/A) as a surgical intervention .  The patient's history has been reviewed, patient examined, no change in status, stable for surgery.  I have reviewed the patient's chart and labs.  Questions were answered to the patient's satisfaction.     Lakendrick Paradis H

## 2015-01-17 NOTE — Op Note (Signed)
01/17/2015  12:00 PM  PATIENT:  Travis Larson, 54 y.o., male, MRN: [id number]  PREOP DIAGNOSIS:  perforated colon diverticulitis  POSTOP DIAGNOSIS:   History of perforated diverticulitis  PROCEDURE:   Procedure(s): COLONOSCOPY  SURGEON:   Ovidio Kinavid Douglass Dunshee, M.D.  ANESTHESIA:   Moderate Sedation  Versed - 5 mg, Fentanyl - 75 mcg     INDICATIONS FOR PROCEDURE:  Travis FretJay S Larson is a 54 y.o. (DOB: 19-Jul-1960) white  male whose primary care physician is Dwana MelenaZack Hall, MD and comes for colonoscopy   The indications and risks of the surgery were explained to the patient.  The risks include, but are not limited to, infection, bleeding, and nerve injury.  PROCEDURE:  [facility]   A time out was held and the surgical check list run.   He was placed in the left lateral decubitus position.  He was given sedation.   I passed the Pentex colonoscopy up the rectum to 20 cm.  So I did a sigmoidoscopy.  There was some debris in the colon.  He had no polyp or mass.  The mucosa of the sigmoid/rectum looked okay otherwise.   I then tried to cannulate his colostomy with the colonoscope.Marland Kitchen.  He has a pin hole - 2 - 3 mm opening at his LLQ ostomy.  I opened this up to about 1.0 cm with a hemostat, but still could not get the scope to pass.   So I only did a sigmoidoscopy.   I discussed the findings with the patient and his wife.  Ovidio Kinavid Brylon Brenning, MD, [facility] Pager: [phone] Office phone:  [phone]

## 2015-01-17 NOTE — Progress Notes (Signed)
Dr. Ezzard StandingNewman attempted to stretch colostomy sight with hemostat during procedure. Colostomy site actively bleeding post-op, 3 milliliters of blood noted in colostomy bag. Patient stated he was not in any discomfort and did not feel weak or faint.

## 2015-01-18 ENCOUNTER — Encounter (HOSPITAL_COMMUNITY): Payer: Self-pay | Admitting: Surgery

## 2015-01-23 NOTE — Patient Instructions (Addendum)
Travis Larson  01/23/2015   Your procedure is scheduled on: 02-06-15  Report to [facility]  [facility]  [facility] to 3rd floor to  Short Stay Center at 515 AM.  Call this number if you have problems the morning of surgery [phone]   Remember: ONLY 1 PERSON MAY GO WITH YOU TO SHORT STAY TO GET  READY MORNING OF YOUR SURGERY.  Do not eat food or drink liquids :After Midnight.  FOLLOW ALL BOWEL PREP INSTRUCTIONS FROM DR Ezzard StandingNEWMAN   Take these medicines the morning of surgery with A SIP OF WATER: none                               You may not have any metal on your body including hair pins and              piercings  Do not wear jewelry, make-up, lotions, powders or perfumes, deodorant             Do not wear nail polish.  Do not shave  48 hours prior to surgery.              Men may shave face and neck.   Do not bring valuables to the hospital. [facility] IS NOT             RESPONSIBLE   FOR VALUABLES.  Contacts, dentures or bridgework may not be worn into surgery.  Leave suitcase in the car. After surgery it may be brought to your room.     Patients discharged the day of surgery will not be allowed to drive home.  Name and phone number of your driver:  Special Instructions: N/A              Please read over the following fact sheets you were given: _____________________________________________________________________             [facility] - Preparing for Surgery Before surgery, you can play an important role.  Because skin is not sterile, your skin needs to be as free of germs as possible.  You can reduce the number of germs on your skin by washing with CHG (chlorahexidine gluconate) soap before surgery.  CHG is an antiseptic cleaner which kills germs and bonds with the skin to continue killing germs even after washing. Please DO NOT use if you have an allergy to CHG or antibacterial soaps.  If your skin becomes reddened/irritated stop using  the CHG and inform your nurse when you arrive at Short Stay. Do not shave (including legs and underarms) for at least 48 hours prior to the first CHG shower.  You may shave your face/neck. Please follow these instructions carefully:  1.  Shower with CHG Soap the night before surgery and the  morning of Surgery.  2.  If you choose to wash your hair, wash your hair first as usual with your  normal  shampoo.  3.  After you shampoo, rinse your hair and body thoroughly to remove the  shampoo.                           4.  Use CHG as you would any other liquid soap.  You can apply chg directly  to the skin and wash  Gently with a scrungie or clean washcloth.  5.  Apply the CHG Soap to your body ONLY FROM THE NECK DOWN.   Do not use on face/ open                           Wound or open sores. Avoid contact with eyes, ears mouth and genitals (private parts).                       Wash face,  Genitals (private parts) with your normal soap.             6.  Wash thoroughly, paying special attention to the area where your surgery  will be performed.  7.  Thoroughly rinse your body with warm water from the neck down.  8.  DO NOT shower/wash with your normal soap after using and rinsing off  the CHG Soap.                9.  Pat yourself dry with a clean towel.            10.  Wear clean pajamas.            11.  Place clean sheets on your bed the night of your first shower and do not  sleep with pets. Day of Surgery : Do not apply any lotions/deodorants the morning of surgery.  Please wear clean clothes to the hospital/surgery center.  FAILURE TO FOLLOW THESE INSTRUCTIONS MAY RESULT IN THE CANCELLATION OF YOUR SURGERY PATIENT SIGNATURE_________________________________  NURSE SIGNATURE__________________________________  ________________________________________________________________________

## 2015-01-25 ENCOUNTER — Encounter (HOSPITAL_COMMUNITY): Payer: Self-pay

## 2015-01-25 ENCOUNTER — Encounter (HOSPITAL_COMMUNITY)
Admission: RE | Admit: 2015-01-25 | Discharge: 2015-01-25 | Disposition: A | Discharge status: Home / Self Care | Payer: BLUE CROSS/BLUE SHIELD | Source: Ambulatory Visit | Attending: Surgery | Admitting: Surgery

## 2015-01-25 DIAGNOSIS — Z01818 Encounter for other preprocedural examination: Secondary | ICD-10-CM | POA: Insufficient documentation

## 2015-01-25 HISTORY — DX: Diverticulitis of intestine, part unspecified, without perforation or abscess without bleeding: K57.92

## 2015-01-25 HISTORY — DX: Fracture of neck, unspecified, initial encounter: S12.9XXA

## 2015-01-25 LAB — CBC WITH DIFFERENTIAL/PLATELET
BASOS ABS: 0 10*3/uL (ref 0.0–0.1)
BASOS PCT: 0 %
EOS ABS: 0.1 10*3/uL (ref 0.0–0.7)
EOS PCT: 3 %
HCT: 45.5 % (ref 39.0–52.0)
Hemoglobin: 15.8 g/dL (ref 13.0–17.0)
LYMPHS PCT: 23 %
Lymphs Abs: 1.3 10*3/uL (ref 0.7–4.0)
MCH: 32 pg (ref 26.0–34.0)
MCHC: 34.7 g/dL (ref 30.0–36.0)
MCV: 92.1 fL (ref 78.0–100.0)
Monocytes Absolute: 0.4 10*3/uL (ref 0.1–1.0)
Monocytes Relative: 7 %
Neutro Abs: 3.6 10*3/uL (ref 1.7–7.7)
Neutrophils Relative %: 67 %
PLATELETS: 212 10*3/uL (ref 150–400)
RBC: 4.94 MIL/uL (ref 4.22–5.81)
RDW: 13.7 % (ref 11.5–15.5)
WBC: 5.5 10*3/uL (ref 4.0–10.5)

## 2015-01-26 LAB — HEMOGLOBIN A1C
HEMOGLOBIN A1C: 5.2 % (ref 4.8–5.6)
MEAN PLASMA GLUCOSE: 103 mg/dL

## 2015-02-05 NOTE — Anesthesia Preprocedure Evaluation (Signed)
Anesthesia Evaluation  Patient identified by MRN, date of birth, ID band Patient awake    Reviewed: Allergy & Precautions, NPO status , Patient's Chart, lab work & pertinent test results  Airway Mallampati: II  TM Distance: >3 FB Neck ROM: Full   Comment: Denies any symptoms from neck fracture and plating. States full ROM Dental no notable dental hx. (+) Dental Advisory Given, Teeth Intact   Pulmonary Current Smoker,    Pulmonary exam normal breath sounds clear to auscultation       Cardiovascular negative cardio ROS Normal cardiovascular exam Rhythm:Regular Rate:Tachycardia     Neuro/Psych Seizures -, Well Controlled,  Last seizure 10 years ago due to low blood sugar. Cervical compression fracture with surgery 2012 negative psych ROS   GI/Hepatic negative GI ROS, Neg liver ROS,   Endo/Other  negative endocrine ROS  Renal/GU negative Renal ROS  negative genitourinary   Musculoskeletal negative musculoskeletal ROS (+)   Abdominal   Peds negative pediatric ROS (+)  Hematology negative hematology ROS (+)   Anesthesia Other Findings   Reproductive/Obstetrics negative OB ROS                             Anesthesia Physical Anesthesia Plan  ASA: II  Anesthesia Plan: General   Post-op Pain Management:    Induction: Intravenous  Airway Management Planned: Oral ETT  Additional Equipment:   Intra-op Plan:   Post-operative Plan: Extubation in OR  Informed Consent:   Plan Discussed with: Surgeon  Anesthesia Plan Comments:         Anesthesia Quick Evaluation

## 2015-02-05 NOTE — H&P (Signed)
Travis Larson  Location: [facility] Patient #: [id number] DOB: [birth year] Married / Language: English / Race: White Male   History of Present Illness   The patient is a 54 year old male who presents with diverticulitis.   His PCP is Dr. Hughie ClossZ. Hall.  He comes with his wife.   He is scheduled for surgery 02/06/2015 to reverse his colostomy. I answered questions about the surgery and plan. I gave him a book on diverticular sugery.  He is given his bowel prep. I reiewed the colonoscopy.   History of colostomy: The patient [facility] Aug [facility] with a perforated sigmoid colon. He underwent a sigmoid colectomy with end colostomy on 04 Aug 2014 by Dr. Ovidio Kinavid Olegario Emberson. I gave him a copy of his path report.  Plan: 1) colostomy reversal - 02/06/2015  Past Medical History: 1. Gallstones 2. Remote history of seizures Last seizure >6 years ago. He does not see a neurologist and is on no meds 3. Broke neck 2 years ago in [place] [facility] neck. He said he injured blood vessel in right neck. 4. Smokes 1 ppd  SOCIAL and FAMILY HISTORY: Married. 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)  Other Problems Kandis Cocking(Agnes Probert H Liviya Santini, MD; 01/25/2015 10:53 AM) Other disease, cancer, significant illness Seizure Disorder  Allergies Fay Records(Ashley Beck, CMA; 01/25/2015 10:33 AM) No Known Drug Allergies05/20/2016  Medication History Fay Records(Ashley Beck, CMA; 01/25/2015 10:33 AM) No Current Medications Medications Reconciled  Vitals Fay Records(Ashley Beck CMA; 01/25/2015 10:33 AM) 01/25/2015 10:33 AM Weight: 180 lb Height: 71in Body Surface  Area: 2.02 m Body Mass Index: 25.1 kg/m  Temp.: 97.62F(Temporal)  Pulse: 80 (Regular)  BP: 138/76 (Sitting, Left Arm, Standard)   Physical Exam  General: Red haired WM alert. Continues to do better. HEENT: Normal. Pupils equal.  Neck: Supple. No mass. No thyroid mass.   Lungs: Clear to auscultation and symmetric breath sounds. Heart: RRR. No murmur or rub.  Abdomen: Soft. No mass. Normal bowel sounds.  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 Since we are planning to reverse this in around 2 weeks, it needs nothing else done to it.  Extremities: Good strength and ROM in upper and lower extremities.  Assessment & Plan  1.  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 Alyviah Crandle  Impression: Plan:   1) For colostomy reversal 02/06/2015   2) Bowel prep and antibiotics given  2.  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  Ovidio Kinavid Shalynn Jorstad, MD, [facility] Pager: [phone] Office phone:  [phone]

## 2015-02-06 ENCOUNTER — Encounter (HOSPITAL_COMMUNITY): Payer: Self-pay | Admitting: *Deleted

## 2015-02-06 ENCOUNTER — Inpatient Hospital Stay (HOSPITAL_COMMUNITY): Payer: BLUE CROSS/BLUE SHIELD | Admitting: Anesthesiology

## 2015-02-06 ENCOUNTER — Encounter (HOSPITAL_COMMUNITY)
Admission: RE | Disposition: A | Discharge status: Home / Self Care | Payer: Self-pay | Source: Ambulatory Visit | Attending: Surgery

## 2015-02-06 ENCOUNTER — Inpatient Hospital Stay (HOSPITAL_COMMUNITY)
Admission: RE | Admit: 2015-02-06 | Discharge: 2015-02-10 | DRG: 331 | Disposition: A | Discharge status: Home / Self Care | Payer: BLUE CROSS/BLUE SHIELD | Source: Ambulatory Visit | Attending: Surgery | Admitting: Surgery

## 2015-02-06 DIAGNOSIS — Z9889 Other specified postprocedural states: Secondary | ICD-10-CM

## 2015-02-06 DIAGNOSIS — F17210 Nicotine dependence, cigarettes, uncomplicated: Secondary | ICD-10-CM | POA: Diagnosis present

## 2015-02-06 DIAGNOSIS — Z01812 Encounter for preprocedural laboratory examination: Secondary | ICD-10-CM | POA: Diagnosis not present

## 2015-02-06 DIAGNOSIS — K660 Peritoneal adhesions (postprocedural) (postinfection): Secondary | ICD-10-CM | POA: Diagnosis present

## 2015-02-06 DIAGNOSIS — K8020 Calculus of gallbladder without cholecystitis without obstruction: Secondary | ICD-10-CM | POA: Diagnosis present

## 2015-02-06 DIAGNOSIS — Z433 Encounter for attention to colostomy: Principal | ICD-10-CM

## 2015-02-06 HISTORY — PX: COLOSTOMY TAKEDOWN: SHX5258

## 2015-02-06 SURGERY — CLOSURE, COLOSTOMY, LAPAROSCOPIC
Anesthesia: General | Site: Abdomen

## 2015-02-06 MED ORDER — LIDOCAINE HCL (CARDIAC) 20 MG/ML IV SOLN
INTRAVENOUS | Status: DC | PRN
  Administered 2015-02-06: 50 mg via INTRAVENOUS

## 2015-02-06 MED ORDER — ALVIMOPAN 12 MG PO CAPS
12.0000 mg | ORAL_CAPSULE | Freq: Two times a day (BID) | ORAL | Status: DC
Start: 2015-02-07 — End: 2015-02-10
  Administered 2015-02-07 – 2015-02-09 (×5): 12 mg via ORAL
  Filled 2015-02-06 (×9): qty 1

## 2015-02-06 MED ORDER — BUPIVACAINE HCL (PF) 0.25 % IJ SOLN
INTRAMUSCULAR | Status: AC
  Filled 2015-02-06: qty 30

## 2015-02-06 MED ORDER — NALOXONE HCL 0.4 MG/ML IJ SOLN: 0.4000 mg | INTRAMUSCULAR | Status: DC | PRN

## 2015-02-06 MED ORDER — HYDROMORPHONE HCL 2 MG/ML IJ SOLN
INTRAMUSCULAR | Status: AC
  Filled 2015-02-06: qty 1

## 2015-02-06 MED ORDER — KETOROLAC TROMETHAMINE 30 MG/ML IJ SOLN
30.0000 mg | Freq: Three times a day (TID) | INTRAMUSCULAR | Status: AC
  Administered 2015-02-06 – 2015-02-07 (×3): 30 mg via INTRAVENOUS
  Filled 2015-02-06 (×6): qty 1

## 2015-02-06 MED ORDER — LACTATED RINGERS IR SOLN
Status: DC | PRN
  Administered 2015-02-06: 1000 mL

## 2015-02-06 MED ORDER — HYDROMORPHONE HCL 1 MG/ML IJ SOLN
INTRAMUSCULAR | Status: DC | PRN
  Administered 2015-02-06: .4 mg via INTRAVENOUS

## 2015-02-06 MED ORDER — DIPHENHYDRAMINE HCL 50 MG/ML IJ SOLN: 12.5000 mg | Freq: Four times a day (QID) | INTRAMUSCULAR | Status: DC | PRN

## 2015-02-06 MED ORDER — PROPOFOL 10 MG/ML IV BOLUS
INTRAVENOUS | Status: DC | PRN
  Administered 2015-02-06: 150 mg via INTRAVENOUS

## 2015-02-06 MED ORDER — ONDANSETRON HCL 4 MG/2ML IJ SOLN: 4.0000 mg | Freq: Four times a day (QID) | INTRAMUSCULAR | Status: DC | PRN

## 2015-02-06 MED ORDER — LACTATED RINGERS IV SOLN
INTRAVENOUS | Status: DC | PRN
  Administered 2015-02-06 (×3): via INTRAVENOUS

## 2015-02-06 MED ORDER — NEOSTIGMINE METHYLSULFATE 10 MG/10ML IV SOLN
INTRAVENOUS | Status: AC
  Filled 2015-02-06: qty 1

## 2015-02-06 MED ORDER — ROCURONIUM BROMIDE 100 MG/10ML IV SOLN
INTRAVENOUS | Status: AC
  Filled 2015-02-06: qty 1

## 2015-02-06 MED ORDER — FENTANYL CITRATE (PF) 250 MCG/5ML IJ SOLN
INTRAMUSCULAR | Status: AC
  Filled 2015-02-06: qty 25

## 2015-02-06 MED ORDER — EPHEDRINE SULFATE 50 MG/ML IJ SOLN
INTRAMUSCULAR | Status: AC
  Filled 2015-02-06: qty 1

## 2015-02-06 MED ORDER — DEXTROSE 5 % IV SOLN
2.0000 g | INTRAVENOUS | Status: AC
  Administered 2015-02-06: 2 g via INTRAVENOUS

## 2015-02-06 MED ORDER — LACTATED RINGERS IV SOLN: INTRAVENOUS | Status: DC

## 2015-02-06 MED ORDER — 0.9 % SODIUM CHLORIDE (POUR BTL) OPTIME
TOPICAL | Status: DC | PRN
  Administered 2015-02-06: 4000 mL

## 2015-02-06 MED ORDER — SODIUM CHLORIDE 0.9 % IJ SOLN
INTRAMUSCULAR | Status: AC
  Filled 2015-02-06: qty 10

## 2015-02-06 MED ORDER — ONDANSETRON HCL 4 MG/2ML IJ SOLN
INTRAMUSCULAR | Status: DC | PRN
  Administered 2015-02-06: 4 mg via INTRAVENOUS

## 2015-02-06 MED ORDER — LIDOCAINE HCL (CARDIAC) 20 MG/ML IV SOLN
INTRAVENOUS | Status: AC
  Filled 2015-02-06: qty 5

## 2015-02-06 MED ORDER — ALVIMOPAN 12 MG PO CAPS
12.0000 mg | ORAL_CAPSULE | Freq: Once | ORAL | Status: AC
  Administered 2015-02-06: 12 mg via ORAL
  Filled 2015-02-06: qty 1

## 2015-02-06 MED ORDER — HYDROMORPHONE HCL 1 MG/ML IJ SOLN
INTRAMUSCULAR | Status: AC
  Filled 2015-02-06: qty 1

## 2015-02-06 MED ORDER — MIDAZOLAM HCL 2 MG/2ML IJ SOLN
INTRAMUSCULAR | Status: AC
  Filled 2015-02-06: qty 4

## 2015-02-06 MED ORDER — PROPOFOL 10 MG/ML IV BOLUS
INTRAVENOUS | Status: AC
  Filled 2015-02-06: qty 20

## 2015-02-06 MED ORDER — PHENYLEPHRINE HCL 10 MG/ML IJ SOLN
INTRAMUSCULAR | Status: DC | PRN
  Administered 2015-02-06: 40 ug via INTRAVENOUS
  Administered 2015-02-06: 80 ug via INTRAVENOUS

## 2015-02-06 MED ORDER — EPHEDRINE SULFATE 50 MG/ML IJ SOLN
INTRAMUSCULAR | Status: DC | PRN
  Administered 2015-02-06: 5 mg via INTRAVENOUS
  Administered 2015-02-06: 10 mg via INTRAVENOUS

## 2015-02-06 MED ORDER — CHLORHEXIDINE GLUCONATE 4 % EX LIQD: 60.0000 mL | Freq: Once | CUTANEOUS | Status: DC

## 2015-02-06 MED ORDER — HEPARIN SODIUM (PORCINE) 5000 UNIT/ML IJ SOLN
5000.0000 [IU] | Freq: Three times a day (TID) | INTRAMUSCULAR | Status: DC
  Administered 2015-02-06 – 2015-02-10 (×11): 5000 [IU] via SUBCUTANEOUS
  Filled 2015-02-06 (×17): qty 1

## 2015-02-06 MED ORDER — ONDANSETRON HCL 4 MG/2ML IJ SOLN
INTRAMUSCULAR | Status: AC
  Filled 2015-02-06: qty 2

## 2015-02-06 MED ORDER — CEFOTETAN DISODIUM-DEXTROSE 2-2.08 GM-% IV SOLR
INTRAVENOUS | Status: AC
  Filled 2015-02-06: qty 50

## 2015-02-06 MED ORDER — MIDAZOLAM HCL 5 MG/5ML IJ SOLN
INTRAMUSCULAR | Status: DC | PRN
  Administered 2015-02-06: 2 mg via INTRAVENOUS

## 2015-02-06 MED ORDER — DEXTROSE 5 % IV SOLN
2.0000 g | Freq: Two times a day (BID) | INTRAVENOUS | Status: AC
  Administered 2015-02-06: 2 g via INTRAVENOUS
  Filled 2015-02-06: qty 2

## 2015-02-06 MED ORDER — POTASSIUM CHLORIDE IN NACL 20-0.45 MEQ/L-% IV SOLN
INTRAVENOUS | Status: DC
  Administered 2015-02-06: 16:00:00 via INTRAVENOUS
  Administered 2015-02-06 – 2015-02-07 (×2): 125 mL/h via INTRAVENOUS
  Administered 2015-02-07 – 2015-02-08 (×2): via INTRAVENOUS
  Administered 2015-02-08: 125 mL/h via INTRAVENOUS
  Administered 2015-02-09 – 2015-02-10 (×3): via INTRAVENOUS
  Filled 2015-02-06 (×18): qty 1000

## 2015-02-06 MED ORDER — MORPHINE SULFATE 2 MG/ML IV SOLN
INTRAVENOUS | Status: AC
  Filled 2015-02-06: qty 25

## 2015-02-06 MED ORDER — SODIUM CHLORIDE 0.9 % IJ SOLN: 9.0000 mL | INTRAMUSCULAR | Status: DC | PRN

## 2015-02-06 MED ORDER — NEOSTIGMINE METHYLSULFATE 10 MG/10ML IV SOLN
INTRAVENOUS | Status: DC | PRN
  Administered 2015-02-06: 4 mg via INTRAVENOUS

## 2015-02-06 MED ORDER — ONDANSETRON HCL 4 MG PO TABS
4.0000 mg | ORAL_TABLET | Freq: Four times a day (QID) | ORAL | Status: DC | PRN
  Filled 2015-02-06: qty 1

## 2015-02-06 MED ORDER — SUCCINYLCHOLINE CHLORIDE 20 MG/ML IJ SOLN
INTRAMUSCULAR | Status: DC | PRN
  Administered 2015-02-06: 100 mg via INTRAVENOUS

## 2015-02-06 MED ORDER — CETYLPYRIDINIUM CHLORIDE 0.05 % MT LIQD
7.0000 mL | Freq: Two times a day (BID) | OROMUCOSAL | Status: DC
Start: 2015-02-06 — End: 2015-02-09
  Administered 2015-02-06 – 2015-02-08 (×5): 7 mL via OROMUCOSAL

## 2015-02-06 MED ORDER — DIPHENHYDRAMINE HCL 12.5 MG/5ML PO ELIX
12.5000 mg | ORAL_SOLUTION | Freq: Four times a day (QID) | ORAL | Status: DC | PRN
  Filled 2015-02-06: qty 5

## 2015-02-06 MED ORDER — ROCURONIUM BROMIDE 100 MG/10ML IV SOLN
INTRAVENOUS | Status: DC | PRN
  Administered 2015-02-06: 10 mg via INTRAVENOUS
  Administered 2015-02-06: 20 mg via INTRAVENOUS
  Administered 2015-02-06: 30 mg via INTRAVENOUS
  Administered 2015-02-06: 20 mg via INTRAVENOUS
  Administered 2015-02-06: 30 mg via INTRAVENOUS
  Administered 2015-02-06: 20 mg via INTRAVENOUS

## 2015-02-06 MED ORDER — GLYCOPYRROLATE 0.2 MG/ML IJ SOLN
INTRAMUSCULAR | Status: DC | PRN
  Administered 2015-02-06: .6 mg via INTRAVENOUS

## 2015-02-06 MED ORDER — GLYCOPYRROLATE 0.2 MG/ML IJ SOLN
INTRAMUSCULAR | Status: AC
  Filled 2015-02-06: qty 3

## 2015-02-06 MED ORDER — PHENYLEPHRINE HCL 10 MG/ML IJ SOLN
INTRAMUSCULAR | Status: AC
  Filled 2015-02-06: qty 1

## 2015-02-06 MED ORDER — HEPARIN SODIUM (PORCINE) 5000 UNIT/ML IJ SOLN
5000.0000 [IU] | Freq: Once | INTRAMUSCULAR | Status: AC
  Administered 2015-02-06: 5000 [IU] via SUBCUTANEOUS
  Filled 2015-02-06: qty 1

## 2015-02-06 MED ORDER — HYDROMORPHONE HCL 1 MG/ML IJ SOLN
0.2500 mg | INTRAMUSCULAR | Status: DC | PRN
  Administered 2015-02-06 (×2): 0.5 mg via INTRAVENOUS

## 2015-02-06 MED ORDER — MORPHINE SULFATE 2 MG/ML IV SOLN
INTRAVENOUS | Status: DC
  Administered 2015-02-06: 12 mg via INTRAVENOUS
  Administered 2015-02-06: 3 mg via INTRAVENOUS
  Administered 2015-02-06: 13:00:00 via INTRAVENOUS
  Administered 2015-02-07: 6 mg via INTRAVENOUS
  Administered 2015-02-07: 19.5 mg via INTRAVENOUS
  Administered 2015-02-07: 3 mg via INTRAVENOUS
  Administered 2015-02-07: 4.5 mg via INTRAVENOUS
  Administered 2015-02-07: 19.5 mg via INTRAVENOUS
  Administered 2015-02-07: 15 mg via INTRAVENOUS
  Administered 2015-02-07: 0.3 mg via INTRAVENOUS
  Administered 2015-02-08: 18 mg via INTRAVENOUS
  Administered 2015-02-08 (×2): 15 mg via INTRAVENOUS
  Administered 2015-02-08: 10.5 mg via INTRAVENOUS
  Filled 2015-02-06 (×3): qty 25

## 2015-02-06 MED ORDER — FENTANYL CITRATE (PF) 100 MCG/2ML IJ SOLN
INTRAMUSCULAR | Status: DC | PRN
  Administered 2015-02-06 (×2): 100 ug via INTRAVENOUS
  Administered 2015-02-06: 50 ug via INTRAVENOUS

## 2015-02-06 SURGICAL SUPPLY — 76 items
APPLIER CLIP 5 13 M/L LIGAMAX5 (MISCELLANEOUS)
APPLIER CLIP ROT 10 11.4 M/L (STAPLE)
BLADE EXTENDED COATED 6.5IN (ELECTRODE) ×3 IMPLANT
BLADE HEX COATED 2.75 (ELECTRODE) ×6 IMPLANT
BLADE SURG SZ10 CARB STEEL (BLADE) IMPLANT
CELLS DAT CNTRL 66122 CELL SVR (MISCELLANEOUS) IMPLANT
CLIP APPLIE 5 13 M/L LIGAMAX5 (MISCELLANEOUS) IMPLANT
CLIP APPLIE ROT 10 11.4 M/L (STAPLE) IMPLANT
COVER MAYO STAND STRL (DRAPES) ×3 IMPLANT
COVER SURGICAL LIGHT HANDLE (MISCELLANEOUS) ×6 IMPLANT
DECANTER SPIKE VIAL GLASS SM (MISCELLANEOUS) ×3 IMPLANT
DEVICE TROCAR PUNCTURE CLOSURE (ENDOMECHANICALS) IMPLANT
DISSECTOR BLUNT TIP ENDO 5MM (MISCELLANEOUS) ×3 IMPLANT
DRAPE LAPAROSCOPIC ABDOMINAL (DRAPES) ×3 IMPLANT
DRAPE LG THREE QUARTER DISP (DRAPES) ×3 IMPLANT
DRAPE SHEET LG 3/4 BI-LAMINATE (DRAPES) IMPLANT
DRAPE WARM FLUID 44X44 (DRAPE) ×3 IMPLANT
DRSG OPSITE POSTOP 4X10 (GAUZE/BANDAGES/DRESSINGS) IMPLANT
DRSG OPSITE POSTOP 4X12 (GAUZE/BANDAGES/DRESSINGS) ×3 IMPLANT
DRSG OPSITE POSTOP 4X6 (GAUZE/BANDAGES/DRESSINGS) IMPLANT
DRSG OPSITE POSTOP 4X8 (GAUZE/BANDAGES/DRESSINGS) IMPLANT
ELECT PENCIL ROCKER SW 15FT (MISCELLANEOUS) ×3 IMPLANT
ELECT REM PT RETURN 9FT ADLT (ELECTROSURGICAL) ×3
ELECTRODE REM PT RTRN 9FT ADLT (ELECTROSURGICAL) ×1 IMPLANT
GAUZE SPONGE 2X2 8PLY STRL LF (GAUZE/BANDAGES/DRESSINGS) ×1 IMPLANT
GAUZE SPONGE 4X4 12PLY STRL (GAUZE/BANDAGES/DRESSINGS) IMPLANT
GLOVE SURG SIGNA 7.5 PF LTX (GLOVE) ×6 IMPLANT
GOWN STRL REUS W/TWL XL LVL3 (GOWN DISPOSABLE) ×12 IMPLANT
KIT BASIN OR (CUSTOM PROCEDURE TRAY) ×3 IMPLANT
LEGGING LITHOTOMY PAIR STRL (DRAPES) IMPLANT
LIGASURE IMPACT 36 18CM CVD LR (INSTRUMENTS) IMPLANT
LIQUID BAND (GAUZE/BANDAGES/DRESSINGS) ×3 IMPLANT
RTRCTR WOUND ALEXIS 18CM MED (MISCELLANEOUS)
SCISSORS LAP 5X35 DISP (ENDOMECHANICALS) ×3 IMPLANT
SET IRRIG TUBING LAPAROSCOPIC (IRRIGATION / IRRIGATOR) ×3 IMPLANT
SHEARS HARMONIC ACE PLUS 36CM (ENDOMECHANICALS) ×3 IMPLANT
SLEEVE ADV FIXATION 5X100MM (TROCAR) IMPLANT
SLEEVE XCEL OPT CAN 5 100 (ENDOMECHANICALS) ×12 IMPLANT
SPONGE GAUZE 2X2 STER 10/PKG (GAUZE/BANDAGES/DRESSINGS) ×2
SPONGE LAP 18X18 X RAY DECT (DISPOSABLE) ×3 IMPLANT
STAPLER CUT CVD 40MM BLUE (STAPLE) ×6 IMPLANT
STAPLER VISISTAT 35W (STAPLE) ×6 IMPLANT
SUCTION POOLE TIP (SUCTIONS) ×6 IMPLANT
SUT MNCRL AB 4-0 PS2 18 (SUTURE) ×3 IMPLANT
SUT NOVA NAB DX-16 0-1 5-0 T12 (SUTURE) ×3 IMPLANT
SUT NOVA T20/GS 25 (SUTURE) ×6 IMPLANT
SUT PDS AB 1 CT1 27 (SUTURE) IMPLANT
SUT PDS AB 1 CTX 36 (SUTURE) ×9 IMPLANT
SUT PDS AB 4-0 SH 27 (SUTURE) IMPLANT
SUT PROLENE 2 0 KS (SUTURE) ×3 IMPLANT
SUT PROLENE 2 0 SH DA (SUTURE) ×3 IMPLANT
SUT SILK 2 0 (SUTURE) ×2
SUT SILK 2 0 SH CR/8 (SUTURE) ×6 IMPLANT
SUT SILK 2-0 18XBRD TIE 12 (SUTURE) ×1 IMPLANT
SUT SILK 3 0 (SUTURE) ×2
SUT SILK 3 0 SH CR/8 (SUTURE) ×3 IMPLANT
SUT SILK 3-0 18XBRD TIE 12 (SUTURE) ×1 IMPLANT
SUT VIC AB 2-0 CT1 27 (SUTURE)
SUT VIC AB 2-0 CT1 27XBRD (SUTURE) IMPLANT
SUT VIC AB 2-0 SH 27 (SUTURE) ×2
SUT VIC AB 2-0 SH 27X BRD (SUTURE) ×1 IMPLANT
SUT VIC AB 3-0 PS2 18 (SUTURE)
SUT VIC AB 3-0 PS2 18XBRD (SUTURE) IMPLANT
SYR BULB IRRIGATION 50ML (SYRINGE) ×3 IMPLANT
TOWEL OR 17X26 10 PK STRL BLUE (TOWEL DISPOSABLE) ×6 IMPLANT
TRAY FOLEY W/METER SILVER 14FR (SET/KITS/TRAYS/PACK) ×3 IMPLANT
TRAY FOLEY W/METER SILVER 16FR (SET/KITS/TRAYS/PACK) ×3 IMPLANT
TRAY LAPAROSCOPIC (CUSTOM PROCEDURE TRAY) ×3 IMPLANT
TROCAR ADV FIXATION 5X100MM (TROCAR) IMPLANT
TROCAR BLADELESS OPT 5 100 (ENDOMECHANICALS) IMPLANT
TROCAR XCEL NON-BLD 11X100MML (ENDOMECHANICALS) IMPLANT
TUBING CONNECTING 10 (TUBING) IMPLANT
TUBING CONNECTING 10' (TUBING)
TUBING FILTER THERMOFLATOR (ELECTROSURGICAL) ×3 IMPLANT
YANKAUER SUCT BULB TIP 10FT TU (MISCELLANEOUS) ×3 IMPLANT
YANKAUER SUCT BULB TIP NO VENT (SUCTIONS) ×3 IMPLANT

## 2015-02-06 NOTE — Anesthesia Procedure Notes (Signed)
Procedure Name: Intubation Date/Time: 02/06/2015 7:25 AM Performed by: Carolyne FiscalINMAN, Lynden Carrithers F Pre-anesthesia Checklist: Patient identified, Emergency Drugs available, Suction available, Patient being monitored and Timeout performed Patient Re-evaluated:Patient Re-evaluated prior to inductionOxygen Delivery Method: Circle system utilized Preoxygenation: Pre-oxygenation with 100% oxygen Intubation Type: IV induction Ventilation: Mask ventilation without difficulty Laryngoscope Size: Miller and 2 Grade View: Grade I Tube type: Oral Tube size: 7.5 mm Number of attempts: 1 Airway Equipment and Method: Stylet Placement Confirmation: ETT inserted through vocal cords under direct vision,  positive ETCO2 and breath sounds checked- equal and bilateral Secured at: 23 cm Tube secured with: Tape Dental Injury: Teeth and Oropharynx as per pre-operative assessment

## 2015-02-06 NOTE — Interval H&P Note (Signed)
History and Physical Interval Note:  02/06/2015 7:09 AM  Travis Larson  has presented today for surgery, with the diagnosis of colostomy  The various methods of treatment have been discussed with the patient and family. His wife is here with him today.  After consideration of risks, benefits and other options for treatment, the patient has consented to  Procedure(s): LAPAROSCOPIC ASSISTED COLOSTOMY CLOSURE (N/A) as a surgical intervention .  The patient's history has been reviewed, patient examined, no change in status, stable for surgery.  I have reviewed the patient's chart and labs.  Questions were answered to the patient's satisfaction.     Korianna Washer H

## 2015-02-06 NOTE — Transfer of Care (Signed)
Immediate Anesthesia Transfer of Care Note  Patient: Travis Larson  Procedure(s) Performed: Procedure(s): LAPAROSCOPIC ASSISTED COLOSTOMY CLOSURE WITH LYSIS OF ADHESIONS (N/A)  Patient Location: PACU  Anesthesia Type:General  Level of Consciousness: awake, alert  and oriented  Airway & Oxygen Therapy: Patient Spontanous Breathing and Patient connected to face mask oxygen  Post-op Assessment: Report given to RN and Post -op Vital signs reviewed and stable  Post vital signs: Reviewed and stable  Last Vitals: There were no vitals filed for this visit.  Complications: No apparent anesthesia complications

## 2015-02-06 NOTE — Anesthesia Postprocedure Evaluation (Signed)
  Anesthesia Post-op Note  Patient: Travis Larson  Procedure(s) Performed: Procedure(s) (LRB): LAPAROSCOPIC ASSISTED COLOSTOMY CLOSURE WITH LYSIS OF ADHESIONS (N/A)  Patient Location: PACU  Anesthesia Type: General  Level of Consciousness: awake and alert   Airway and Oxygen Therapy: Patient Spontanous Breathing  Post-op Pain: mild  Post-op Assessment: Post-op Vital signs reviewed, Patient's Cardiovascular Status Stable, Respiratory Function Stable, Patent Airway and No signs of Nausea or vomiting  Last Vitals:  Filed Vitals:   02/06/15 1639  BP: 118/58  Pulse: 82  Temp: 36.6 C  Resp: 11    Post-op Vital Signs: stable   Complications: No apparent anesthesia complications

## 2015-02-07 LAB — BASIC METABOLIC PANEL
Anion gap: 7 (ref 5–15)
BUN: 11 mg/dL (ref 6–20)
CALCIUM: 8.1 mg/dL — AB (ref 8.9–10.3)
CHLORIDE: 106 mmol/L (ref 101–111)
CO2: 24 mmol/L (ref 22–32)
CREATININE: 0.99 mg/dL (ref 0.61–1.24)
GFR calc non Af Amer: >60 mL/min (ref >60)
Glucose, Bld: 90 mg/dL (ref 65–99)
Potassium: 4.1 mmol/L (ref 3.5–5.1)
Sodium: 137 mmol/L (ref 135–145)

## 2015-02-07 LAB — CBC
HCT: 38.9 % — ABNORMAL LOW (ref 39.0–52.0)
Hemoglobin: 13.8 g/dL (ref 13.0–17.0)
MCH: 33.3 pg (ref 26.0–34.0)
MCHC: 35.5 g/dL (ref 30.0–36.0)
MCV: 94 fL (ref 78.0–100.0)
PLATELETS: 194 10*3/uL (ref 150–400)
RBC: 4.14 MIL/uL — AB (ref 4.22–5.81)
RDW: 13.7 % (ref 11.5–15.5)
WBC: 8.1 10*3/uL (ref 4.0–10.5)

## 2015-02-07 NOTE — Op Note (Signed)
Travis Larson, Travis Larson NO.:  [id number]  MEDICAL RECORD NO.:  [id number]  LOCATION:  1534                         FACILITY:  [facility]  PHYSICIAN:  Sandria Bales. Ezzard Standing, M.D.  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  PROCEDURES:  Laparoscopic-assisted colostomy closure with lysis of adhesions for 60 minutes and mobilization of the splenic flexure.  SURGEON:  Sandria Bales. Ezzard Standing, M.D.  FIRST ASSISTANT:  Sharlet Salina T. Hoxworth, M.D.  ANESTHESIA:  General endotracheal supervised by Dr. Ronelle Nigh.  ESTIMATED BLOOD LOSS:  150 mL.  DRAINS LEFT IN:  None.  LOCAL MEDICINE USE:  None.  SPECIMEN:  Distal sigmoid colon and the colostomy sent as the same specimen.  COUNTS:  Correct.  INDICATION FOR PROCEDURE:  [name] is a 54 year old white male who sees Dr. Catalina Pizza, his primary care doctor.  He presented on Aug 04, 2014, with a perforated sigmoid colon.  He required emergency sigmoid colectomy and end colostomy.  He has done well since that time with his midline wound healing.  His colostomy did form somewhat of a stricture and a preop barium enema showed no residual polyp or mass in his colon.  He now comes for reversal of his colostomy, completed a mechanical and antibiotic bowel prep.  He was taken to room #6.  The indications and potential complications of the surgery was explained to the patient Potential complications included, but not limited to, infection, bleeding, leak from the bile, and nerve injury.  OPERATIVE NOTE:  The patient was in room #4, underwent a general endotracheal anesthetic.  He was placed in lithotomy position.  His abdomen was prepped with ChloraPrep, his perineum prepped with Betadine.  He was  given 2 g of cefotetan at the initiation of the procedure and sterilely draped.    A time-out was held and the surgical checklist run.    I accessed the abdominal cavity through the right upper quadrant with a 5-mm Optiview.  I then placed four additional trocars, one in the right lower quadrant, two in the midline scar, and one in the left lower midline.  I spent about an hour doing enterolysis, taking down adhesions around the colostomy, down in the pelvis along the left colonic gutter where his prior sigmoid colon was, and small bowel scarred against the rectal stump.  I then mobilized the left colon and the splenic flexure with the Harmonic scalpel.  I saw the fat of the left kidney posteriorly and did no compromise the spleen.  I then turned my attention to the pelvis.  He had two loops of small bowel that stuck against the colon stump staple line.  I was unable to free this up laparoscopically; therefore, I converted to an open incision going tthrough the lower half of his prior midline incision to get into his abdominal cavity.  I spent probably about an hour doing enterolysis prior to opening and had mobilized the splenic flexure and  freed up the ostomy at this point.  I took the two loops of small bowel off the end of the distal sigmoid stump.  I did not think I had made any enterotomy into the small bowel.  I then mobilized the distal 7-8 cm of the distal sigmoid colon towards the peritoneal reflection.  I got to where I thought I had soft colon. I used a blue load of the Ethicon Contour stapler to the distal sigmoid colon (about 7 cm) as a specimen. I was about 5 cm above the peritoneal reflection with a staple line.  I then excised the colostomy, went back about 5-6 cm from the colostomy, used the suturing device and a 2-0 Prolene to form a suture line for a pursestring and divided the colon, this dilated up to a 29 EEA stapler.  I then put the 29 EEA stapler, tied this down and cleaned up  the proximal colon.  There was no tension on this, going down to the rectum.  I thought the mobilization of splenic flexure helped this a lot.  Dr. Johna Sheriff then broke scrub, went below and passed first the EEA 25 sizing rod, then the 29 EEA sizing rod.  The stapler went up to the rectal stump.  It was about 20 cm to the anal verge.  We then did the stapled end-to-end EEA anastomosis with a 29 Ethicon stapler, this went posterior to the staple line.  There was a little bit of thinning out of the distal rectal stump on the anterior line, I put four 2-0 silk sutures in this to reinforce this to the colon.  I then clamped the small bowel.  Dr. Johna Sheriff insufflated this with the sigmoidoscope, he cannot really see the staple line, but insufflated under pressure.  There was no leak and had two complete rings identified on the EEA stapler, but these were not same because this was not operation for malignancy.  I then irrigated the abdomen with 2 liters of saline.  We then changed gowns and gloves for abdominal closure.  I closed the left colostomy with a posterior layer of interrupted #1 PDS suture and anterior layer transversely with interrupted #1 PDS suture.  I then closed the midline wound and closed the peritoneum with 2-0 Vicryl suture.  I closed the fascia itself with two running #1 Novafil sutures with interrupted #1 PDS sutures.  Of note, the prior closure with #1 Novafil with interrupted Novafil, so this would be a good closure, so I thought I used the same suture since he has such success with his prior operation.  I then irrigated the wound out thoroughly with a liter of saline.  I closed the skin in the midline with 3-0 Vicryl subcutaneous and a 4-0 Monocryl and Vicryl.  I closed staples of the ostomy wound and the four trocar sites.  I did laparoscope it one more time prior to removing the last of the trocars.  There was no bowel caught in the suture line and looked like it closed well, but hoping  that minimum would come down, but really looking that maybe halfway down his abdominal wall.  There was no evidence of any active bleeding.  The patient tolerated the procedure well, was transported to the recovery room in good condition.  Sponge and needle count were correct at the end of the case.  I did not put an NG tube, but I did leave a Foley.   Sandria Bales. Ezzard Standing, M.D., FACS, scribe for Epic  DHN/MEDQ  D:  02/06/2015  T:  02/07/2015  Job:  [id number]  cc:   Catalina PizzaZach Hall, M.D. Fax: [phone]

## 2015-02-07 NOTE — Progress Notes (Signed)
General Surgery Note  LOS: 1 day  POD -  1 Day Post-Op  Assessment/Plan: 1.  LAPAROSCOPIC ASSISTED COLOSTOMY CLOSURE WITH LYSIS OF ADHESIONS, mobilization of splenic flexure - 02/06/2015 - D. Rober Skeels  NPO  On Entereg  2. Gallstones 3. Remote history of seizures Last seizure >6 years ago. He does not see a neurologist and is on no meds 4. Broke neck 2 years ago in [place] [facility]. He said he injured blood vessel in right neck. 5. Smokes 1 ppd  6.  DVT prophylaxis - SQ Heparin 7.  To try his foley out   Active Problems:   S/P colostomy takedown  Subjective:  Did okay last PM.  Moderate amount of pain, but he says it is much better than last time. Objective:   Filed Vitals:   02/07/15 0511  BP: 129/73  Pulse: 89  Temp: 98 F (36.7 C)  Resp: 12     Intake/Output from previous day:  11/08 0701 - 11/09 0700 In: 4452.1 [I.V.:4452.1] Out: 1175 [Urine:1175]  Intake/Output this shift:      Physical Exam:   General: WN WM who is alert and oriented.    HEENT: Normal. Pupils equal. .   Lungs: Clear.  IS about 1,000cc   Abdomen: Soft, quiet   Wound: Clean   Lab Results:    Recent Labs  02/07/15 0523  WBC 8.1  HGB 13.8  HCT 38.9*  PLT 194    BMET   Recent Labs  02/07/15 0523  NA 137  K 4.1  CL 106  CO2 24  GLUCOSE 90  BUN 11  CREATININE 0.99  CALCIUM 8.1*    PT/INR  No results for input(s): LABPROT, INR in the last 72 hours.  ABG  No results for input(s): PHART, HCO3 in the last 72 hours.  Invalid input(s): PCO2, PO2   Studies/Results:  No results found.   Anti-infectives:   Anti-infectives    Start     Dose/Rate Route Frequency Ordered Stop   02/06/15 1800  cefoTEtan (CEFOTAN) 2 g in dextrose 5 % 50 mL IVPB     2 g 100 mL/hr over 30 Minutes Intravenous Every 12 hours 02/06/15 1316 02/06/15 1834   02/06/15 0531  cefoTEtan (CEFOTAN) 2 g in dextrose 5 % 50 mL IVPB     2 g 100 mL/hr over 30  Minutes Intravenous On call to O.R. 02/06/15 0531 02/06/15 [id number]      Ovidio Kinavid Nicholad Kautzman, MD, FACS Pager: [phone] [facility] Office: [phone] 02/07/2015

## 2015-02-08 LAB — CBC WITH DIFFERENTIAL/PLATELET
Basophils Absolute: 0 10*3/uL (ref 0.0–0.1)
Basophils Relative: 0 %
Eosinophils Absolute: 0.1 10*3/uL (ref 0.0–0.7)
Eosinophils Relative: 1 %
HCT: 37.2 % — ABNORMAL LOW (ref 39.0–52.0)
Hemoglobin: 12.9 g/dL — ABNORMAL LOW (ref 13.0–17.0)
Lymphocytes Relative: 14 %
Lymphs Abs: 1.3 10*3/uL (ref 0.7–4.0)
MCH: 32.5 pg (ref 26.0–34.0)
MCHC: 34.7 g/dL (ref 30.0–36.0)
MCV: 93.7 fL (ref 78.0–100.0)
Monocytes Absolute: 0.7 10*3/uL (ref 0.1–1.0)
Monocytes Relative: 8 %
Neutro Abs: 7.1 10*3/uL (ref 1.7–7.7)
Neutrophils Relative %: 77 %
Platelets: 195 10*3/uL (ref 150–400)
RBC: 3.97 MIL/uL — ABNORMAL LOW (ref 4.22–5.81)
RDW: 13.5 % (ref 11.5–15.5)
WBC: 9.1 10*3/uL (ref 4.0–10.5)

## 2015-02-08 LAB — BASIC METABOLIC PANEL
Anion gap: 8 (ref 5–15)
BUN: 13 mg/dL (ref 6–20)
CO2: 23 mmol/L (ref 22–32)
Calcium: 8.2 mg/dL — ABNORMAL LOW (ref 8.9–10.3)
Chloride: 105 mmol/L (ref 101–111)
Creatinine, Ser: 0.8 mg/dL (ref 0.61–1.24)
GFR calc Af Amer: >60 mL/min (ref >60)
GFR calc non Af Amer: >60 mL/min (ref >60)
Glucose, Bld: 81 mg/dL (ref 65–99)
Potassium: 4.5 mmol/L (ref 3.5–5.1)
Sodium: 136 mmol/L (ref 135–145)

## 2015-02-08 NOTE — Progress Notes (Signed)
General Surgery Note  LOS: 2 days  POD -  2 Days Post-Op  Assessment/Plan: 1.  LAPAROSCOPIC ASSISTED COLOSTOMY CLOSURE WITH LYSIS OF ADHESIONS, mobilization of splenic flexure - 02/06/2015 - D. Justinian Miano  NPO with ice chips  On Entereg  2. Gallstones 3. Remote history of seizures Last seizure >6 years ago. He does not see a neurologist and is on no meds 4. Broke neck 2 years ago in [place] [facility]. He said he injured blood vessel in right neck. 5. Smokes 1 ppd  6.  DVT prophylaxis - SQ Heparin 7.  Did well with foley out   Active Problems:   S/P colostomy takedown  Subjective:  Doing well.  Moving and breathing well.  Objective:   Filed Vitals:   02/08/15 0455  BP: 123/68  Pulse: 85  Temp: 98.1 F (36.7 C)  Resp: 11     Intake/Output from previous day:  11/09 0701 - 11/10 0700 In: 3100 [I.V.:3100] Out: 1375 [Urine:1375]  Intake/Output this shift:      Physical Exam:   General: WN WM who is alert and oriented.    HEENT: Normal. Pupils equal. .   Lungs: Clear.  IS about 1,600cc  Did better today.   Abdomen: Soft, quiet   Wound: Clean   Lab Results:     Recent Labs  02/07/15 0523 02/08/15 0443  WBC 8.1 9.1  HGB 13.8 12.9*  HCT 38.9* 37.2*  PLT 194 195    BMET    Recent Labs  02/07/15 0523 02/08/15 0443  NA 137 136  K 4.1 4.5  CL 106 105  CO2 24 23  GLUCOSE 90 81  BUN 11 13  CREATININE 0.99 0.80  CALCIUM 8.1* 8.2*    PT/INR  No results for input(s): LABPROT, INR in the last 72 hours.  ABG  No results for input(s): PHART, HCO3 in the last 72 hours.  Invalid input(s): PCO2, PO2   Studies/Results:  No results found.   Anti-infectives:   Anti-infectives    Start     Dose/Rate Route Frequency Ordered Stop   02/06/15 1800  cefoTEtan (CEFOTAN) 2 g in dextrose 5 % 50 mL IVPB     2 g 100 mL/hr over 30 Minutes Intravenous Every 12 hours 02/06/15 1316 02/06/15 1834   02/06/15 0531  cefoTEtan  (CEFOTAN) 2 g in dextrose 5 % 50 mL IVPB     2 g 100 mL/hr over 30 Minutes Intravenous On call to O.R. 02/06/15 0531 02/06/15 [id number]      Ovidio Kinavid Nichol Ator, MD, FACS Pager: [phone] [facility] Office: [phone] 02/08/2015

## 2015-02-09 MED ORDER — MORPHINE SULFATE (PF) 2 MG/ML IV SOLN: 1.0000 mg | INTRAVENOUS | Status: DC | PRN

## 2015-02-09 MED ORDER — HYDROCODONE-ACETAMINOPHEN 5-325 MG PO TABS
1.0000 | ORAL_TABLET | ORAL | Status: DC | PRN
  Administered 2015-02-09: 2 via ORAL
  Filled 2015-02-09: qty 2
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
## 2015-02-10 MED ORDER — OXYCODONE-ACETAMINOPHEN 5-325 MG PO TABS: 1.0000 | ORAL_TABLET | ORAL | Status: DC | PRN

## 2015-02-10 NOTE — Discharge Instructions (Signed)
Advance diet back to solids slowly as tolerated.

## 2015-02-10 NOTE — Discharge Summary (Signed)
Physician Discharge Summary  Patient ID: Travis Larson MRN: [id number] DOB/AGE: 04-29-1960 54 y.o.  Admit date: 02/06/2015 Discharge date: 02/10/2015  Admission Diagnoses:  Colostomy in place  Discharge Diagnoses:  Same post lap assisted takedown of colostomy  Principal Problem:   S/P colostomy takedown   Surgery:  Lap assisted colostomy takedown  Discharged Condition: improved  Hospital Course:   Had surgery on Tuesday.  Did well postop.  Began passing flatus and liquids begun and advanced.  Passing flatus and bowel movements.  Ready for discharge on Saturday.    Consults: none  Significant Diagnostic Studies: none    Discharge Exam: Blood pressure 123/77, pulse 71, temperature 97.7 F (36.5 C), temperature source Oral, resp. rate 18, height 5\' 11"  (1.803 m), weight 82.555 kg (182 lb), SpO2 97 %. Incisions without redness and staples in place.  Ostomy site without evidence of infection or drainage with intact staples  Disposition: 01-Home or Self Care  Discharge Instructions    Diet - low sodium heart healthy    Complete by:  As directed      Discharge instructions    Complete by:  As directed   May shower Call and return to office [phone] this week for staple removal     Discharge wound care:    Complete by:  As directed   You have staples in place and these can be removed in the [facility] office around this coming Friday.  Call to make those arrangements  [phone] You can leave the wounds open and shower ad lib.  Some serous drainage to be expected.     Increase activity slowly    Complete by:  As directed   Advance to solid diet slowly but maintain good hydration with liquid intake            Medication List    STOP taking these medications        amoxicillin-clavulanate 875-125 MG tablet  Commonly known as:  AUGMENTIN      TAKE these medications        oxyCODONE-acetaminophen 5-325 MG tablet  Commonly known as:  PERCOCET/ROXICET  Take 1 tablet by mouth  every 4 (four) hours as needed for moderate pain or severe pain.     saccharomyces boulardii 250 MG capsule  Commonly known as:  FLORASTOR  Take 1 capsule (250 mg total) by mouth 2 (two) times daily.           Follow-up Information    [facility] Schedule an appointment as soon as possible for a visit in 1 week.   Specialty:  General Surgery   Why:  For wound re-check on Friday   Contact information:   [address] [place] [address] [phone]       Signed: Valarie MerinoMARTIN,Akili Corsetti B 02/10/2015, 9:06 AM

## 2015-02-10 NOTE — Progress Notes (Signed)
Nurse reviewed discharge instructions with pt.  Pt verbalized understanding of discharge instructions, follow up appointment and new medication.  Prescription given to pt prior to discharge. No concerns at this time.

## 2015-06-13 IMAGING — CT CT ANGIO CHEST
1 of 6 series · 5 of 36 positions shown · IV contrast (Omnipaque 300)
Comparison: Chest radiograph August 14, 2014

CLINICAL DATA: LEFT-sided chest pain beginning this morning, worse
with inspiration. Recent abdominal surgery, resulting and colostomy.
Twenty year history of smoking.

EXAM:
CT ANGIOGRAPHY CHEST WITH CONTRAST
TECHNIQUE: Multidetector CT imaging of the chest was performed using the
standard protocol during bolus administration of intravenous
contrast. Multiplanar CT image reconstructions and MIPs were
obtained to evaluate the vascular anatomy.
CONTRAST:  100mL OMNIPAQUE IOHEXOL 350 MG/ML SOLN

[Series 4: pe 3.0 b40f · axial · 0.68mm/px · z∈[-304,-136]mm · 5 of 84 slices shown]
[im 14/84  lung]
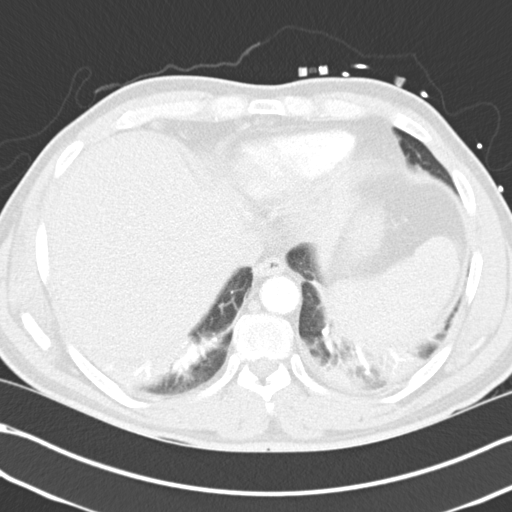
[im 28/84  mediastinal]
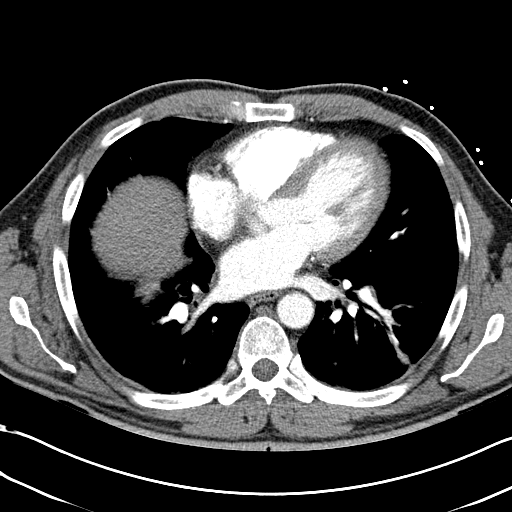
[im 42/84  lung]
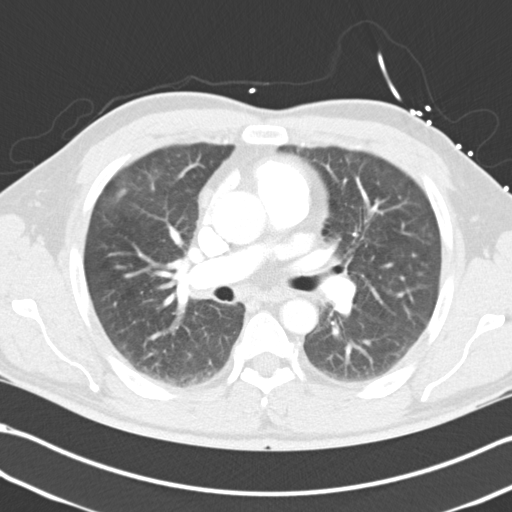
[im 56/84  mediastinal]
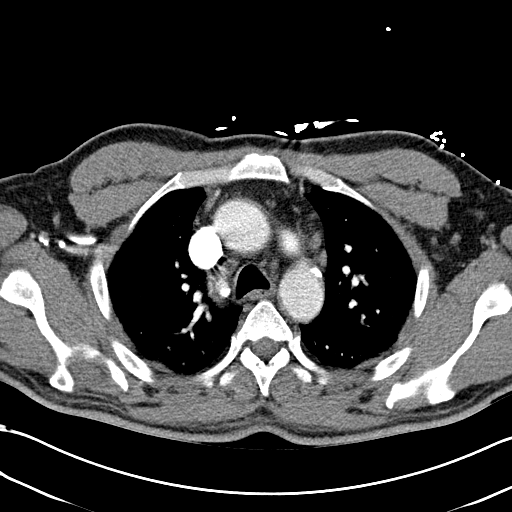
[im 70/84  lung]
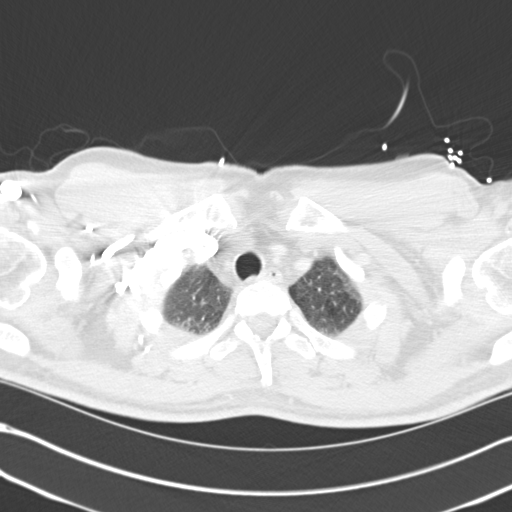

[5 of 36 positions shown; findings below may reference images not displayed]

FINDINGS: Mild motion degraded examination.

PULMONARY ARTERY: Adequate contrast opacification of the pulmonary
artery's. Main pulmonary artery is not enlarged. No pulmonary
arterial filling defects to the level of the subsegmental branches.

MEDIASTINUM: Heart appears mildly enlarged, no right heart strain.
Mild pulmonary vascular congestion No pericardial fluid collections.
Thoracic aorta is normal course and caliber, mild calcific
atherosclerosis. Borderline lymphadenopathy, 9 mm short axis
aortopulmonary window lymph node, smaller pretracheal lymph nodes.

LUNGS: Tracheobronchial tree is patent, no pneumothorax. Bilateral
lower lobe enhancing discoid atelectasis without pleural effusion or
focal consolidation. Tracheobronchial tree is patent. No
pneumothorax.

SOFT TISSUES AND OSSEOUS STRUCTURES: Included view of the abdomen is
unremarkable. Visualized soft tissues and included osseous
structures appear normal.

Review of the MIP images confirms the above findings.
IMPRESSION: No acute pulmonary embolism on this mildly motion degraded
examination.

Mild cardiomegaly and pulmonary vascular congestion. Bilateral lower
lobe atelectasis.

Borderline mediastinal lymphadenopathy is likely reactive.

By: Piet-Hein Bas

## 2020-03-25 ENCOUNTER — Ambulatory Visit
Admission: EM | Admit: 2020-03-25 | Discharge: 2020-03-25 | Disposition: A | Discharge status: Home / Self Care | Payer: BC Managed Care – PPO | Attending: Family Medicine | Admitting: Family Medicine

## 2020-03-25 DIAGNOSIS — B3749 Other urogenital candidiasis: Secondary | ICD-10-CM

## 2020-03-25 DIAGNOSIS — R5383 Other fatigue: Secondary | ICD-10-CM | POA: Diagnosis not present

## 2020-03-25 DIAGNOSIS — R509 Fever, unspecified: Secondary | ICD-10-CM | POA: Diagnosis not present

## 2020-03-25 DIAGNOSIS — R0982 Postnasal drip: Secondary | ICD-10-CM

## 2020-03-25 DIAGNOSIS — Z1152 Encounter for screening for COVID-19: Secondary | ICD-10-CM

## 2020-03-25 DIAGNOSIS — B349 Viral infection, unspecified: Secondary | ICD-10-CM | POA: Diagnosis not present

## 2020-03-25 MED ORDER — CLOTRIMAZOLE 1 % EX CREA: TOPICAL_CREAM | CUTANEOUS | 0 refills | Status: DC

## 2020-03-25 NOTE — ED Triage Notes (Signed)
Pt presents with c/o fever, body aches and feeling unwell that began yesterday  , pt also concerned about swelling on penis after covid vaccine that began months ago
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
## 2020-03-25 NOTE — Discharge Instructions (Signed)
I have sent in clotrimazole cream for you to apply to the tip of your penis twice daily until symptoms resolve  Your COVID and Flu tests are pending.  You should self quarantine until the test results are back.    Take Tylenol or ibuprofen as needed for fever or discomfort.  Rest and keep yourself hydrated.    Follow-up with your primary care provider if your symptoms are not improving.

## 2020-03-27 LAB — COVID-19, FLU A+B NAA
Influenza A, NAA: NOT DETECTED
Influenza B, NAA: NOT DETECTED
SARS-CoV-2, NAA: DETECTED — AB

## 2020-07-16 ENCOUNTER — Other Ambulatory Visit: Payer: Self-pay

## 2020-07-16 ENCOUNTER — Encounter: Payer: Self-pay | Admitting: Emergency Medicine

## 2020-07-16 ENCOUNTER — Ambulatory Visit
Admission: EM | Admit: 2020-07-16 | Discharge: 2020-07-16 | Disposition: A | Discharge status: Home / Self Care | Payer: BC Managed Care – PPO | Attending: Internal Medicine | Admitting: Internal Medicine

## 2020-07-16 DIAGNOSIS — M62838 Other muscle spasm: Secondary | ICD-10-CM | POA: Diagnosis not present

## 2020-07-16 MED ORDER — CYCLOBENZAPRINE HCL 10 MG PO TABS: 10.0000 mg | ORAL_TABLET | Freq: Three times a day (TID) | ORAL | 0 refills | Status: DC

## 2020-07-16 NOTE — ED Triage Notes (Signed)
Neck x 1 week, no known injury recently.  Hx of previous neck fracture.  States his pain feels like a pulled muscle.

## 2020-07-16 NOTE — Discharge Instructions (Addendum)
Do blood pressure diaries and if is more than 140/90 you need to see your primary care Dr to get back on medication. Be working on establishing with a new primary care doctor.
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
## 2021-11-25 ENCOUNTER — Ambulatory Visit
Admission: EM | Admit: 2021-11-25 | Discharge: 2021-11-25 | Disposition: A | Discharge status: Home / Self Care | Payer: BC Managed Care – PPO | Attending: Nurse Practitioner | Admitting: Nurse Practitioner

## 2021-11-25 DIAGNOSIS — R21 Rash and other nonspecific skin eruption: Secondary | ICD-10-CM | POA: Diagnosis not present

## 2021-11-25 DIAGNOSIS — R030 Elevated blood-pressure reading, without diagnosis of hypertension: Secondary | ICD-10-CM

## 2021-11-25 MED ORDER — DEXAMETHASONE SODIUM PHOSPHATE 10 MG/ML IJ SOLN
10.0000 mg | Freq: Once | INTRAMUSCULAR | Status: AC
  Administered 2021-11-25: 10 mg via INTRAMUSCULAR

## 2021-11-25 MED ORDER — TRIAMCINOLONE ACETONIDE 0.1 % EX OINT: 1.0000 | TOPICAL_OINTMENT | Freq: Two times a day (BID) | CUTANEOUS | 0 refills | Status: DC
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
## 2021-11-25 NOTE — ED Triage Notes (Signed)
Pt presents with c/o rash on legs and scalp that began on Friday

## 2021-11-25 NOTE — Discharge Instructions (Addendum)
-   We have given you a steroid shot today to help with the itching and inflammation inside your body from the rash -Please start using the Kenalog ointment twice daily to the affected areas.  This should help with the itching and inflammation from the insect bites -You can also start on Zyrtec 10 mg daily as well as Benadryl 25 to 50 mg at nighttime as needed for itching.  Both of these are antihistamines and should help with the itching. -Follow-up with your primary care provider regarding elevated blood pressure -Follow-up if your pain persists or worsens despite treatment

## 2021-12-03 ENCOUNTER — Telehealth: Payer: Self-pay

## 2021-12-03 ENCOUNTER — Ambulatory Visit
Admission: EM | Admit: 2021-12-03 | Discharge: 2021-12-03 | Disposition: A | Discharge status: Home / Self Care | Payer: BC Managed Care – PPO

## 2021-12-03 DIAGNOSIS — M5431 Sciatica, right side: Secondary | ICD-10-CM | POA: Diagnosis not present

## 2021-12-03 MED ORDER — CYCLOBENZAPRINE HCL 10 MG PO TABS: 10.0000 mg | ORAL_TABLET | Freq: Three times a day (TID) | ORAL | 0 refills | Status: DC | PRN

## 2021-12-03 MED ORDER — PREDNISONE 20 MG PO TABS: 40.0000 mg | ORAL_TABLET | Freq: Every day | ORAL | 0 refills | Status: DC

## 2021-12-03 NOTE — ED Triage Notes (Signed)
Pt reports right sided lower back pain, radiates to right leg x 2-3 days. Tylenol gives some relief.
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
## 2022-07-24 ENCOUNTER — Encounter (HOSPITAL_COMMUNITY): Payer: Self-pay

## 2022-07-24 ENCOUNTER — Emergency Department (HOSPITAL_COMMUNITY): Payer: BC Managed Care – PPO

## 2022-07-24 ENCOUNTER — Emergency Department (HOSPITAL_COMMUNITY)
Admission: EM | Admit: 2022-07-24 | Discharge: 2022-07-24 | Disposition: A | Discharge status: Home / Self Care | Payer: BC Managed Care – PPO | Attending: Emergency Medicine | Admitting: Emergency Medicine

## 2022-07-24 DIAGNOSIS — M549 Dorsalgia, unspecified: Secondary | ICD-10-CM | POA: Diagnosis not present

## 2022-07-24 DIAGNOSIS — R55 Syncope and collapse: Secondary | ICD-10-CM | POA: Diagnosis not present

## 2022-07-24 DIAGNOSIS — R079 Chest pain, unspecified: Secondary | ICD-10-CM | POA: Diagnosis not present

## 2022-07-24 DIAGNOSIS — R42 Dizziness and giddiness: Secondary | ICD-10-CM | POA: Diagnosis not present

## 2022-07-24 DIAGNOSIS — R0789 Other chest pain: Secondary | ICD-10-CM | POA: Diagnosis not present

## 2022-07-24 DIAGNOSIS — E663 Overweight: Secondary | ICD-10-CM | POA: Diagnosis not present

## 2022-07-24 DIAGNOSIS — T5991XA Toxic effect of unspecified gases, fumes and vapors, accidental (unintentional), initial encounter: Secondary | ICD-10-CM | POA: Diagnosis not present

## 2022-07-24 DIAGNOSIS — Z6827 Body mass index (BMI) 27.0-27.9, adult: Secondary | ICD-10-CM | POA: Diagnosis not present

## 2022-07-24 LAB — CBC
HCT: 48.1 % (ref 39.0–52.0)
Hemoglobin: 16.9 g/dL (ref 13.0–17.0)
MCH: 33.5 pg (ref 26.0–34.0)
MCHC: 35.1 g/dL (ref 30.0–36.0)
MCV: 95.2 fL (ref 80.0–100.0)
Platelets: 268 10*3/uL (ref 150–400)
RBC: 5.05 MIL/uL (ref 4.22–5.81)
RDW: 13 % (ref 11.5–15.5)
WBC: 17.6 10*3/uL — ABNORMAL HIGH (ref 4.0–10.5)
nRBC: 0 % (ref 0.0–0.2)

## 2022-07-24 LAB — BASIC METABOLIC PANEL
Anion gap: 8 (ref 5–15)
BUN: 14 mg/dL (ref 8–23)
CO2: 22 mmol/L (ref 22–32)
Calcium: 8.7 mg/dL — ABNORMAL LOW (ref 8.9–10.3)
Chloride: 105 mmol/L (ref 98–111)
Creatinine, Ser: 1.16 mg/dL (ref 0.61–1.24)
GFR, Estimated: >60 mL/min (ref >60)
Glucose, Bld: 105 mg/dL — ABNORMAL HIGH (ref 70–99)
Potassium: 4.1 mmol/L (ref 3.5–5.1)
Sodium: 135 mmol/L (ref 135–145)

## 2022-07-24 LAB — TROPONIN I (HIGH SENSITIVITY)
Troponin I (High Sensitivity): 23 ng/L — ABNORMAL HIGH (ref <18)
Troponin I (High Sensitivity): 21 ng/L — ABNORMAL HIGH (ref <18)

## 2022-07-24 MED ORDER — ACETAMINOPHEN 500 MG PO TABS
1000.0000 mg | ORAL_TABLET | Freq: Once | ORAL | Status: AC
  Administered 2022-07-24: 1000 mg via ORAL
  Filled 2022-07-24: qty 2

## 2022-07-24 MED ORDER — IBUPROFEN 400 MG PO TABS
600.0000 mg | ORAL_TABLET | Freq: Once | ORAL | Status: AC
  Administered 2022-07-24: 600 mg via ORAL
  Filled 2022-07-24: qty 1

## 2022-07-24 MED ORDER — LIDOCAINE 5 % EX PTCH
1.0000 | MEDICATED_PATCH | Freq: Once | CUTANEOUS | Status: DC
  Administered 2022-07-24: 1 via TRANSDERMAL
  Filled 2022-07-24: qty 1

## 2022-07-24 MED ORDER — LIDOCAINE 5 % EX PTCH: 1.0000 | MEDICATED_PATCH | CUTANEOUS | 0 refills | Status: DC

## 2022-07-24 NOTE — ED Triage Notes (Signed)
Pt arrives today c/o a syncopal episode while at work today- unknown down time. Pt does have a hx of seizures. Pt states that he did not hit head. Pt states that he started having left sided chest pain after event. Pt endorses SHOB with deep breathing.

## 2022-07-24 NOTE — Discharge Instructions (Signed)
You were seen in the emergency department after your episode of passing out.  Your heart was in a normal rhythm here and you had no signs of heart attack.  Your heart enzymes were minimally elevated and were going down on repeat.  You likely bruised your chest wall and had no signs of broken ribs or lung bruising.  You can take Tylenol and Motrin as needed for pain as well as use ice or heat.  You should make sure that you are drinking plenty of fluids and eating normal meals and staying well-hydrated.  He should follow-up with your primary doctor in the next few days to have your symptoms rechecked.  You should return to the emergency department if you have recurrent episodes of passing out, severe chest pain prior to passing out or severe shortness of breath or if you have any other new or concerning symptoms.

## 2022-07-24 NOTE — ED Provider Notes (Signed)
[facility] AT [facility] Provider Note   CSN: [id number] Arrival date & time: 07/24/22  1434     History  Chief Complaint  Patient presents with   Loss of Consciousness   Chest Pain    Travis Larson is a 62 y.o. male.  Patient is a 62 year old male with a past medical history of seizure disorder presenting to the emergency department with syncope.  Patient states that he was at work this afternoon going to the elevator for lunch when he became lightheaded and dizzy and passed out.  He states he does not think that he hit his head.  He states that he thinks that he landed on his left side and has been having pain in the left side of his chest ever since.  He denies any shortness of breath but states that the pain is worse with taking deep breaths and coughing as well as worse with movements.  He denies any other injuries.  He states that he was feeling well prior to this with no recent nausea, vomiting or diarrhea.  He states that bystanders had seen no seizure-like activity and he had no postictal period.  The history is provided by the patient and the spouse.  Loss of Consciousness Associated symptoms: chest pain   Chest Pain Associated symptoms: syncope        Home Medications Prior to Admission medications   Medication Sig Start Date End Date Taking? Authorizing Provider  lidocaine (LIDODERM) 5 % Place 1 patch onto the skin daily. Remove & Discard patch within 12 hours or as directed by MD 07/24/22  Yes Theresia Lo, Cecile Sheerer, DO  acetaminophen (TYLENOL) 500 MG tablet Take 500 mg by mouth every 6 (six) hours as needed.    [provider]  cyclobenzaprine (FLEXERIL) 10 MG tablet Take 1 tablet (10 mg total) by mouth 3 (three) times daily as needed for muscle spasms. Do not drink alcohol or drive while taking this medication.  May cause drowsiness. 12/03/21   Particia Nearing, PA-C  predniSONE (DELTASONE) 20 MG tablet Take 2 tablets (40 mg  total) by mouth daily with breakfast. 12/03/21   Particia Nearing, PA-C  triamcinolone ointment (KENALOG) 0.1 % Apply 1 Application topically 2 (two) times daily. 11/25/21   Valentino Nose, NP      Allergies    Patient has no known allergies.    Review of Systems   Review of Systems  Cardiovascular:  Positive for chest pain and syncope.    Physical Exam Updated Vital Signs BP 123/86 (BP Location: Right Arm)   Pulse 74   Temp 98.1 F (36.7 C) (Oral)   Resp 15   Ht  (1.778 m)   Wt 79.4 kg   SpO2 99%   BMI 25.11 kg/m  Physical Exam Vitals and nursing note reviewed.  Constitutional:      General: He is not in acute distress.    Appearance: He is well-developed.  HENT:     Head: Normocephalic and atraumatic.  Eyes:     Extraocular Movements: Extraocular movements intact.  Cardiovascular:     Rate and Rhythm: Normal rate and regular rhythm.     Pulses:          Radial pulses are 2+ on the right side and 2+ on the left side.     Heart sounds: Normal heart sounds.  Pulmonary:     Effort: Pulmonary effort is normal.     Breath  sounds: Normal breath sounds.  Chest:     Chest wall: Tenderness (Left middle chest just lateral to left) present.  Abdominal:     Palpations: Abdomen is soft.     Tenderness: There is no abdominal tenderness.  Musculoskeletal:        General: Normal range of motion.     Cervical back: Normal range of motion and neck supple.     Right lower leg: No edema.     Left lower leg: No edema.     Comments: No midline back tenderness Pelvis stable, nontender No bony tenderness of bilateral upper or lower extremities  Skin:    General: Skin is warm and dry.  Neurological:     General: No focal deficit present.     Mental Status: He is alert and oriented to person, place, and time.  Psychiatric:        Mood and Affect: Mood normal.        Behavior: Behavior normal.     ED Results / Procedures / Treatments   Labs (all labs ordered are  listed, but only abnormal results are displayed) Labs Reviewed  BASIC METABOLIC PANEL - Abnormal; Notable for the following components:      Result Value   Glucose, Bld 105 (*)    Calcium 8.7 (*)    All other components within normal limits  CBC - Abnormal; Notable for the following components:   WBC 17.6 (*)    All other components within normal limits  TROPONIN I (HIGH SENSITIVITY) - Abnormal; Notable for the following components:   Troponin I (High Sensitivity) 23 (*)    All other components within normal limits  TROPONIN I (HIGH SENSITIVITY) - Abnormal; Notable for the following components:   Troponin I (High Sensitivity) 21 (*)    All other components within normal limits    EKG EKG Interpretation  Date/Time:  Thursday July 24 2022 14:47:31 EDT Ventricular Rate:  82 PR Interval:  164 QRS Duration: 88 QT Interval:  376 QTC Calculation: 439 R Axis:   68 Text Interpretation: Normal sinus rhythm Minimal voltage criteria for LVH, may be normal variant ( Sokolow-Lyon ) Borderline ECG No significant change since last tracing Confirmed by Elayne Snare ([id number]) on 07/24/2022 6:00:45 PM  Radiology DG Chest 2 View  Result Date: 07/24/2022 CLINICAL DATA:  Chest pain after syncopal episode.  Fall. EXAM: CHEST - 2 VIEW COMPARISON:  08/14/2014 FINDINGS: Heart size and pulmonary vascularity are normal. Linear atelectasis or fibrosis in the lung bases. No focal consolidation. No pleural effusions. No pneumothorax. Mediastinal contours appear intact. Calcification of the aorta. IMPRESSION: Linear atelectasis or fibrosis in the lung bases. Electronically Signed   By: Burman Nieves M.D.   On: 07/24/2022 15:06    Procedures Procedures    Medications Ordered in ED Medications  lidocaine (LIDODERM) 5 % 1-3 patch (1 patch Transdermal Patch Applied 07/24/22 1855)  acetaminophen (TYLENOL) tablet 1,000 mg (1,000 mg Oral Given 07/24/22 1854)  ibuprofen (ADVIL) tablet 600 mg (600 mg Oral Given  07/24/22 1853)    ED Course/ Medical Decision Making/ A&P Clinical Course as of 07/24/22 2007  Thu Jul 24, 2022  1930 Rpt troponin flat, orthostatics negative. Patient likely with chest wall contusion. He is stable for discharge with primary care follow up. [VK]    Clinical Course User Index [VK] Rexford Maus, DO  Medical Decision Making This patient presents to the ED with chief complaint(s) of syncope with pertinent past medical history of seizure disorder which further complicates the presenting complaint. The complaint involves an extensive differential diagnosis and also carries with it a high risk of complications and morbidity.    The differential diagnosis includes ACS, arrhythmia, anemia, electrolyte abnormality, orthostatic hypotension, hypo or hyperglycemia  Additional history obtained: Additional history obtained from spouse Records reviewed N/A  ED Course and Reassessment: On patient's arrival to the emergency department he was initially evaluated by provider in triage and had EKG, chest x-ray and labs performed.  EKG is without acute ischemic changes.  Initial troponins minimally elevated at 23 and labs are with a leukocytosis otherwise normal.  The patient has had no recent infectious symptoms and no signs of infection on exam and may be reactionary response to his syncopal episode today.  Chest x-ray showed no acute disease, no evidence of rib fracture or pulmonary contusion.  Patient will be given Tylenol, Motrin and lidocaine patch for likely chest wall contusion and will have repeat troponin as well as orthostatic vitals performed.  Independent labs interpretation:  The following labs were independently interpreted: Leukocytosis, flat troponins  Independent visualization of imaging: - I independently visualized the following imaging with scope of interpretation limited to determining acute life threatening conditions related to  emergency care: Chest x-ray, which revealed no acute disease  Consultation: - Consulted or discussed management/test interpretation w/ external professional: N/A  Consideration for admission or further workup: Patient has no emergent conditions requiring admission or further work-up at this time and is stable for discharge home with primary care follow-up  Social Determinants of health: N/A    Risk OTC drugs. Prescription drug management.          Final Clinical Impression(s) / ED Diagnoses Final diagnoses:  Syncope, unspecified syncope type  Chest wall pain    Rx / DC Orders ED Discharge Orders          Ordered    lidocaine (LIDODERM) 5 %  Every 24 hours        07/24/22 2007              Rexford Maus, DO 07/24/22 2007

## 2022-07-24 NOTE — ED Provider Triage Note (Signed)
Emergency Medicine Provider Triage Evaluation Note  Travis Larson , a 62 y.o. male  was evaluated in triage.  Pt complains of left-sided chest pain.  Patient states that this morning he was at work when he felt dizzy and passed out.  He reportedly fell on concrete.  He denies hitting his head.  He is unsure as to how long he was unconscious.  He was evaluated by EMS and chose not to go to the hospital.  He then drove to [place] with family and started to complain of left-sided chest pain.  He describes it more as tenderness from the fall but patient does have first-degree family history with a father who died of a heart attack in his early 7s.  He denies shortness of breath, radiation of symptoms, nausea, vomiting.  Patient with remote history of seizure not on seizure medications  Review of Systems  Positive: As above Negative: As above  Physical Exam  There were no vitals taken for this visit. Gen:   Awake, no distress   Resp:  Normal effort  MSK:   Moves extremities without difficulty  Other:  Left chest tender to palpation  Medical Decision Making  Medically screening exam initiated at 2:47 PM.  Appropriate orders placed.  Travis Larson was informed that the remainder of the evaluation will be completed by another provider, this initial triage assessment does not replace that evaluation, and the importance of remaining in the ED until their evaluation is complete.     Darrick Grinder, PA-C 07/24/22 1452

## 2022-08-06 ENCOUNTER — Other Ambulatory Visit (HOSPITAL_COMMUNITY): Payer: Self-pay | Admitting: Family Medicine

## 2022-08-06 DIAGNOSIS — R55 Syncope and collapse: Secondary | ICD-10-CM

## 2022-08-06 DIAGNOSIS — Z6825 Body mass index (BMI) 25.0-25.9, adult: Secondary | ICD-10-CM | POA: Diagnosis not present

## 2022-08-06 DIAGNOSIS — R0789 Other chest pain: Secondary | ICD-10-CM | POA: Diagnosis not present

## 2022-08-06 DIAGNOSIS — Z9889 Other specified postprocedural states: Secondary | ICD-10-CM | POA: Diagnosis not present

## 2022-08-06 DIAGNOSIS — E663 Overweight: Secondary | ICD-10-CM | POA: Diagnosis not present

## 2022-08-06 DIAGNOSIS — R569 Unspecified convulsions: Secondary | ICD-10-CM | POA: Diagnosis not present

## 2022-08-19 ENCOUNTER — Ambulatory Visit (HOSPITAL_COMMUNITY)
Admission: RE | Admit: 2022-08-19 | Discharge: 2022-08-19 | Disposition: A | Discharge status: Home / Self Care | Payer: BC Managed Care – PPO | Source: Ambulatory Visit | Attending: Family Medicine | Admitting: Family Medicine

## 2022-08-19 DIAGNOSIS — R55 Syncope and collapse: Secondary | ICD-10-CM | POA: Diagnosis not present

## 2022-08-19 DIAGNOSIS — I6523 Occlusion and stenosis of bilateral carotid arteries: Secondary | ICD-10-CM | POA: Diagnosis not present

## 2022-08-20 DIAGNOSIS — Z6825 Body mass index (BMI) 25.0-25.9, adult: Secondary | ICD-10-CM | POA: Diagnosis not present

## 2022-08-20 DIAGNOSIS — R0789 Other chest pain: Secondary | ICD-10-CM | POA: Diagnosis not present

## 2022-08-20 DIAGNOSIS — R55 Syncope and collapse: Secondary | ICD-10-CM | POA: Diagnosis not present

## 2022-08-20 DIAGNOSIS — E663 Overweight: Secondary | ICD-10-CM | POA: Diagnosis not present

## 2022-08-20 DIAGNOSIS — I6501 Occlusion and stenosis of right vertebral artery: Secondary | ICD-10-CM | POA: Diagnosis not present

## 2022-08-29 DIAGNOSIS — G458 Other transient cerebral ischemic attacks and related syndromes: Secondary | ICD-10-CM | POA: Diagnosis not present

## 2022-09-09 DIAGNOSIS — Z1211 Encounter for screening for malignant neoplasm of colon: Secondary | ICD-10-CM | POA: Diagnosis not present
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
## 2022-10-07 ENCOUNTER — Other Ambulatory Visit
Admission: RE | Admit: 2022-10-07 | Discharge: 2022-10-07 | Disposition: A | Discharge status: Home / Self Care | Payer: BC Managed Care – PPO | Source: Ambulatory Visit | Attending: Cardiovascular Disease | Admitting: Cardiovascular Disease

## 2022-10-07 ENCOUNTER — Encounter: Payer: Self-pay | Admitting: Cardiovascular Disease

## 2022-10-07 ENCOUNTER — Ambulatory Visit (INDEPENDENT_AMBULATORY_CARE_PROVIDER_SITE_OTHER): Payer: BC Managed Care – PPO | Admitting: Cardiovascular Disease

## 2022-10-07 VITALS — BP 160/92 | HR 71 | Ht 70.0 in | Wt 175.8 lb

## 2022-10-07 DIAGNOSIS — R0989 Other specified symptoms and signs involving the circulatory and respiratory systems: Secondary | ICD-10-CM | POA: Diagnosis not present

## 2022-10-07 DIAGNOSIS — R55 Syncope and collapse: Secondary | ICD-10-CM | POA: Insufficient documentation

## 2022-10-07 LAB — BASIC METABOLIC PANEL
Anion gap: 7 (ref 5–15)
BUN: 11 mg/dL (ref 8–23)
CO2: 29 mmol/L (ref 22–32)
Calcium: 9.1 mg/dL (ref 8.9–10.3)
Chloride: 103 mmol/L (ref 98–111)
Creatinine, Ser: 0.96 mg/dL (ref 0.61–1.24)
GFR, Estimated: >60 mL/min (ref >60)
Glucose, Bld: 109 mg/dL — ABNORMAL HIGH (ref 70–99)
Potassium: 4.9 mmol/L (ref 3.5–5.1)
Sodium: 139 mmol/L (ref 135–145)

## 2022-10-07 MED ORDER — METOPROLOL TARTRATE 100 MG PO TABS: ORAL_TABLET | ORAL | 0 refills | Status: DC

## 2022-10-07 NOTE — Addendum Note (Signed)
Addended by: Marlyn Corporal A on: 10/07/2022 09:48 AM   Modules accepted: Orders

## 2022-10-07 NOTE — Patient Instructions (Signed)
Medication Instructions:  Your physician recommends that you continue on your current medications as directed. Please refer to the Current Medication list given to you today.  Take Lopressor 100 mg (1 tablet) 2 hours before cardiac ct  Labwork: BMET today  Testing/Procedures: Your physician has requested that you have an echocardiogram. Echocardiography is a painless test that uses sound waves to create images of your heart. It provides your doctor with information about the size and shape of your heart and how well your heart's chambers and valves are working. This procedure takes approximately one hour. There are no restrictions for this procedure. Please do NOT wear cologne, perfume, aftershave, or lotions (deodorant is allowed). Please arrive 15 minutes prior to your appointment time.    Your physician has recommended that you wear an event monitor. Event monitors are medical devices that record the heart's electrical activity. Doctors most often [place] these monitors to diagnose arrhythmias. Arrhythmias are problems with the speed or rhythm of the heartbeat. The monitor is a small, portable device. You can wear one while you do your normal daily activities. This is usually used to diagnose what is causing palpitations/syncope (passing out).     Your physician has requested that you have cardiac CT. Cardiac computed tomography (CT) is a painless test that uses an x-ray machine to take clear, detailed pictures of your heart. For further information please visit [email or web address] Please follow instruction sheet as given.    Follow-Up: To be determined after testing  Any Other Special Instructions Will Be Listed Below (If Applicable).  If you need a refill on your cardiac medications before your next appointment, please call your pharmacy.

## 2022-10-10 ENCOUNTER — Ambulatory Visit (HOSPITAL_COMMUNITY)
Admission: RE | Admit: 2022-10-10 | Discharge: 2022-10-10 | Disposition: A | Discharge status: Home / Self Care | Payer: BC Managed Care – PPO | Source: Ambulatory Visit | Attending: Cardiovascular Disease | Admitting: Cardiovascular Disease

## 2022-10-10 ENCOUNTER — Encounter (HOSPITAL_COMMUNITY): Payer: Self-pay

## 2022-10-10 ENCOUNTER — Telehealth (HOSPITAL_COMMUNITY): Payer: Self-pay | Admitting: Emergency Medicine

## 2022-10-10 DIAGNOSIS — R55 Syncope and collapse: Secondary | ICD-10-CM | POA: Diagnosis not present

## 2022-10-10 LAB — ECHOCARDIOGRAM COMPLETE
AR max vel: 2.8 cm2
AV Area VTI: 2.81 cm2
AV Area mean vel: 3.06 cm2
AV Mean grad: 4 mmHg
AV Peak grad: 9.6 mmHg
Ao pk vel: 1.55 m/s
Area-P 1/2: 2.66 cm2
S' Lateral: 2.6 cm

## 2022-10-10 NOTE — Telephone Encounter (Signed)
Attempted to call patient regarding upcoming cardiac CT appointment. °Left message on voicemail with name and callback number °Woodruff Skirvin RN Navigator Cardiac Imaging °[facility] °[phone] Office °[phone] Cell ° °

## 2022-10-10 NOTE — Progress Notes (Signed)
*  PRELIMINARY RESULTS* Echocardiogram 2D Echocardiogram has been performed.  Stacey Drain 10/10/2022, 3:42 PM

## 2022-10-13 ENCOUNTER — Ambulatory Visit (HOSPITAL_COMMUNITY)
Admission: RE | Admit: 2022-10-13 | Discharge: 2022-10-13 | Disposition: A | Discharge status: Home / Self Care | Payer: BC Managed Care – PPO | Source: Ambulatory Visit | Attending: Cardiovascular Disease | Admitting: Cardiovascular Disease

## 2022-10-13 DIAGNOSIS — R0989 Other specified symptoms and signs involving the circulatory and respiratory systems: Secondary | ICD-10-CM | POA: Diagnosis not present

## 2022-10-13 DIAGNOSIS — R55 Syncope and collapse: Secondary | ICD-10-CM | POA: Insufficient documentation

## 2022-10-13 DIAGNOSIS — I2510 Atherosclerotic heart disease of native coronary artery without angina pectoris: Secondary | ICD-10-CM | POA: Insufficient documentation

## 2022-10-13 MED ORDER — NITROGLYCERIN 0.4 MG SL SUBL
SUBLINGUAL_TABLET | SUBLINGUAL | Status: AC
  Filled 2022-10-13: qty 2

## 2022-10-13 MED ORDER — IOHEXOL 350 MG/ML SOLN
100.0000 mL | Freq: Once | INTRAVENOUS | Status: AC | PRN
  Administered 2022-10-13: 100 mL via INTRAVENOUS

## 2022-10-13 MED ORDER — NITROGLYCERIN 0.4 MG SL SUBL
0.8000 mg | SUBLINGUAL_TABLET | Freq: Once | SUBLINGUAL | Status: AC
  Administered 2022-10-13: 0.8 mg via SUBLINGUAL

## 2022-10-14 ENCOUNTER — Ambulatory Visit (HOSPITAL_BASED_OUTPATIENT_CLINIC_OR_DEPARTMENT_OTHER)
Admission: RE | Admit: 2022-10-14 | Discharge: 2022-10-14 | Disposition: A | Discharge status: Home / Self Care | Payer: BC Managed Care – PPO | Source: Ambulatory Visit | Attending: Cardiovascular Disease | Admitting: Cardiovascular Disease

## 2022-10-14 ENCOUNTER — Telehealth: Payer: Self-pay

## 2022-10-14 ENCOUNTER — Telehealth: Payer: Self-pay | Admitting: Cardiovascular Disease

## 2022-10-14 ENCOUNTER — Other Ambulatory Visit: Payer: Self-pay | Admitting: Cardiovascular Disease

## 2022-10-14 DIAGNOSIS — I2510 Atherosclerotic heart disease of native coronary artery without angina pectoris: Secondary | ICD-10-CM

## 2022-10-14 DIAGNOSIS — R0989 Other specified symptoms and signs involving the circulatory and respiratory systems: Secondary | ICD-10-CM | POA: Diagnosis not present

## 2022-10-14 DIAGNOSIS — R55 Syncope and collapse: Secondary | ICD-10-CM | POA: Diagnosis not present

## 2022-10-14 DIAGNOSIS — R931 Abnormal findings on diagnostic imaging of heart and coronary circulation: Secondary | ICD-10-CM | POA: Diagnosis not present
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
## 2022-10-14 NOTE — Telephone Encounter (Deleted)
-----   Message from Travis Larson sent at 10/14/2022 11:53 AM EDT ----- He has tight mid LAD lesion needs cath this week see if he can be added on to PA/DOD schedule tomorrow

## 2022-10-14 NOTE — Telephone Encounter (Signed)
Pt informed that cath is not scheduled yet.  Tomorrow is his appt w/ MD to discuss procedure.  Aware they will schedule at his OV appt tomorrow. Patient verbalized understanding and agreeable to plan.

## 2022-10-14 NOTE — Telephone Encounter (Signed)
Patient will see Dr. Diona Browner tomorrow in [place].

## 2022-10-14 NOTE — Telephone Encounter (Signed)
-----   Message from Travis Larson sent at 10/14/2022 11:53 AM EDT ----- He has tight mid LAD lesion needs cath this week see if he can be added on to PA/DOD schedule tomorrow
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
## 2022-10-14 NOTE — Telephone Encounter (Signed)
Patient called and said that he is having a cath done tomorrow and was calling for instructions. Patient only have an appt with Dr. Diona Browner tomorrow

## 2022-10-15 ENCOUNTER — Encounter: Payer: Self-pay | Admitting: Cardiology

## 2022-10-15 ENCOUNTER — Other Ambulatory Visit: Payer: Self-pay | Admitting: Cardiology

## 2022-10-15 ENCOUNTER — Other Ambulatory Visit (HOSPITAL_COMMUNITY)
Admission: RE | Admit: 2022-10-15 | Discharge: 2022-10-15 | Disposition: A | Discharge status: Home / Self Care | Payer: BC Managed Care – PPO | Source: Ambulatory Visit | Attending: Cardiology | Admitting: Cardiology

## 2022-10-15 ENCOUNTER — Ambulatory Visit: Payer: BC Managed Care – PPO | Attending: Cardiovascular Disease | Admitting: Cardiology

## 2022-10-15 VITALS — BP 134/80 | HR 69 | Ht 70.0 in | Wt 174.6 lb

## 2022-10-15 DIAGNOSIS — I2510 Atherosclerotic heart disease of native coronary artery without angina pectoris: Secondary | ICD-10-CM

## 2022-10-15 DIAGNOSIS — E782 Mixed hyperlipidemia: Secondary | ICD-10-CM | POA: Diagnosis not present

## 2022-10-15 DIAGNOSIS — R55 Syncope and collapse: Secondary | ICD-10-CM

## 2022-10-15 DIAGNOSIS — R9389 Abnormal findings on diagnostic imaging of other specified body structures: Secondary | ICD-10-CM | POA: Diagnosis not present

## 2022-10-15 LAB — CBC
HCT: 51 % (ref 39.0–52.0)
Hemoglobin: 17.3 g/dL — ABNORMAL HIGH (ref 13.0–17.0)
MCH: 32.9 pg (ref 26.0–34.0)
MCHC: 33.9 g/dL (ref 30.0–36.0)
MCV: 97 fL (ref 80.0–100.0)
Platelets: 214 10*3/uL (ref 150–400)
RBC: 5.26 MIL/uL (ref 4.22–5.81)
RDW: 13.2 % (ref 11.5–15.5)
WBC: 6.2 10*3/uL (ref 4.0–10.5)
nRBC: 0 % (ref 0.0–0.2)

## 2022-10-15 MED ORDER — ATORVASTATIN CALCIUM 80 MG PO TABS: 80.0000 mg | ORAL_TABLET | Freq: Every day | ORAL | 3 refills | Status: DC

## 2022-10-15 NOTE — Patient Instructions (Signed)
  [facility] OF [facility] [address] [id number] [facility] [place] [address] Dept: [phone] Loc: [phone]  KADARRIUS YANKE  10/15/2022  You are scheduled for a Cardiac Catheterization on Friday, July 19 with Dr. Alverda Skeans.  1. Please arrive at the [facility] (Main Entrance A) at [facility]: [address] [place], [place] [address] at 7:00 AM (This time is 2 hour(s) before your procedure to ensure your preparation). Free valet parking service is available. You will check in at ADMITTING. The support person will be asked to wait in the waiting room.  It is OK to have someone drop you off and come back when you are ready to be discharged.    Special note: Every effort is made to have your procedure done on time. Please understand that emergencies sometimes delay scheduled procedures.  2. Diet: Do not eat solid foods after midnight.  The patient may have clear liquids until 5am upon the day of the procedure.  3. Labs: You will need to have blood drawn on today at [facility] 4. Medication instructions in preparation for your procedure:   Contrast Allergy: No  On the morning of your procedure, take your Aspirin 81 mg and any morning medicines NOT listed above.  You may use sips of water.  5. Plan to go home the same day, you will only stay overnight if medically necessary. 6. Bring a current list of your medications and current insurance cards. 7. You MUST have a responsible person to drive you home. 8. Someone MUST be with you the first 24 hours after you arrive home or your discharge will be delayed. 9. Please wear clothes that are easy to get on and off and wear slip-on shoes.  Thank you for allowing [place] to care for you!   -- [facility]

## 2022-10-16 ENCOUNTER — Telehealth: Payer: Self-pay | Admitting: *Deleted

## 2022-10-16 NOTE — Telephone Encounter (Signed)
Cardiac Catheterization scheduled at [facility] for: Friday October 17, 2022 9 AM Arrival time [facility] Main Entrance A at: 7 AM  Nothing to eat after midnight prior to procedure, clear liquids until 5 AM day of procedure.  Medication instructions: -Usual morning medications can be taken with sips of water including aspirin 81 mg.  Confirmed patient has responsible adult to drive home post procedure and be with patient first 24 hours after arriving home.  Plan to go home the same day, you will only stay overnight if medically necessary.  Reviewed procedure instructions with patient.

## 2022-10-17 ENCOUNTER — Ambulatory Visit (HOSPITAL_COMMUNITY)
Admission: RE | Admit: 2022-10-17 | Discharge: 2022-10-17 | Disposition: A | Discharge status: Home / Self Care | Payer: BC Managed Care – PPO | Attending: Internal Medicine | Admitting: Internal Medicine

## 2022-10-17 ENCOUNTER — Other Ambulatory Visit: Payer: Self-pay

## 2022-10-17 ENCOUNTER — Encounter (HOSPITAL_COMMUNITY)
Admission: RE | Disposition: A | Discharge status: Home / Self Care | Payer: Self-pay | Source: Home / Self Care | Attending: Internal Medicine

## 2022-10-17 DIAGNOSIS — I2510 Atherosclerotic heart disease of native coronary artery without angina pectoris: Secondary | ICD-10-CM | POA: Diagnosis not present

## 2022-10-17 DIAGNOSIS — R9389 Abnormal findings on diagnostic imaging of other specified body structures: Secondary | ICD-10-CM | POA: Diagnosis not present

## 2022-10-17 HISTORY — PX: CORONARY PRESSURE/FFR STUDY: CATH118243

## 2022-10-17 HISTORY — PX: LEFT HEART CATH AND CORONARY ANGIOGRAPHY: CATH118249

## 2022-10-17 LAB — POCT ACTIVATED CLOTTING TIME: Activated Clotting Time: 232 seconds

## 2022-10-17 SURGERY — LEFT HEART CATH AND CORONARY ANGIOGRAPHY
Anesthesia: LOCAL

## 2022-10-17 MED ORDER — SODIUM CHLORIDE 0.9% FLUSH: 3.0000 mL | INTRAVENOUS | Status: DC | PRN

## 2022-10-17 MED ORDER — FENTANYL CITRATE (PF) 100 MCG/2ML IJ SOLN
INTRAMUSCULAR | Status: DC | PRN
  Administered 2022-10-17: 25 ug via INTRAVENOUS

## 2022-10-17 MED ORDER — ASPIRIN 81 MG PO CHEW
CHEWABLE_TABLET | ORAL | Status: AC
  Filled 2022-10-17: qty 1

## 2022-10-17 MED ORDER — ASPIRIN 81 MG PO CHEW
81.0000 mg | CHEWABLE_TABLET | ORAL | Status: AC
  Administered 2022-10-17: 81 mg via ORAL

## 2022-10-17 MED ORDER — SODIUM CHLORIDE 0.9 % IV SOLN: INTRAVENOUS | Status: DC

## 2022-10-17 MED ORDER — LIDOCAINE HCL (PF) 1 % IJ SOLN
INTRAMUSCULAR | Status: DC | PRN
  Administered 2022-10-17: 2 mL

## 2022-10-17 MED ORDER — ACETAMINOPHEN 325 MG PO TABS: 650.0000 mg | ORAL_TABLET | ORAL | Status: DC | PRN

## 2022-10-17 MED ORDER — SODIUM CHLORIDE 0.9% FLUSH: 3.0000 mL | Freq: Two times a day (BID) | INTRAVENOUS | Status: DC

## 2022-10-17 MED ORDER — HYDRALAZINE HCL 20 MG/ML IJ SOLN: 10.0000 mg | INTRAMUSCULAR | Status: DC | PRN

## 2022-10-17 MED ORDER — FENTANYL CITRATE (PF) 100 MCG/2ML IJ SOLN
INTRAMUSCULAR | Status: AC
  Filled 2022-10-17: qty 2

## 2022-10-17 MED ORDER — SODIUM CHLORIDE 0.9 % WEIGHT BASED INFUSION
3.0000 mL/kg/h | INTRAVENOUS | Status: AC
  Administered 2022-10-17: 3 mL/kg/h via INTRAVENOUS

## 2022-10-17 MED ORDER — HEPARIN SODIUM (PORCINE) 1000 UNIT/ML IJ SOLN
INTRAMUSCULAR | Status: DC | PRN
  Administered 2022-10-17: 3000 [IU] via INTRAVENOUS
  Administered 2022-10-17: 1000 [IU] via INTRAVENOUS
  Administered 2022-10-17: 5000 [IU] via INTRAVENOUS

## 2022-10-17 MED ORDER — ONDANSETRON HCL 4 MG/2ML IJ SOLN: 4.0000 mg | Freq: Four times a day (QID) | INTRAMUSCULAR | Status: DC | PRN

## 2022-10-17 MED ORDER — SODIUM CHLORIDE 0.9 % IV SOLN: 250.0000 mL | INTRAVENOUS | Status: DC | PRN

## 2022-10-17 MED ORDER — LIDOCAINE HCL (PF) 1 % IJ SOLN
INTRAMUSCULAR | Status: AC
  Filled 2022-10-17: qty 30

## 2022-10-17 MED ORDER — MIDAZOLAM HCL 2 MG/2ML IJ SOLN
INTRAMUSCULAR | Status: DC | PRN
  Administered 2022-10-17: 1 mg via INTRAVENOUS

## 2022-10-17 MED ORDER — IOHEXOL 350 MG/ML SOLN
INTRAVENOUS | Status: DC | PRN
  Administered 2022-10-17: 80 mL

## 2022-10-17 MED ORDER — SODIUM CHLORIDE 0.9 % WEIGHT BASED INFUSION: 1.0000 mL/kg/h | INTRAVENOUS | Status: DC

## 2022-10-17 MED ORDER — HEPARIN SODIUM (PORCINE) 1000 UNIT/ML IJ SOLN
INTRAMUSCULAR | Status: AC
  Filled 2022-10-17: qty 10

## 2022-10-17 MED ORDER — VERAPAMIL HCL 2.5 MG/ML IV SOLN
INTRAVENOUS | Status: AC
  Filled 2022-10-17: qty 2

## 2022-10-17 MED ORDER — MIDAZOLAM HCL 2 MG/2ML IJ SOLN
INTRAMUSCULAR | Status: AC
  Filled 2022-10-17: qty 2

## 2022-10-17 MED ORDER — VERAPAMIL HCL 2.5 MG/ML IV SOLN
INTRAVENOUS | Status: DC | PRN
  Administered 2022-10-17: 10 mL via INTRA_ARTERIAL

## 2022-10-17 MED ORDER — LABETALOL HCL 5 MG/ML IV SOLN: 10.0000 mg | INTRAVENOUS | Status: DC | PRN

## 2022-10-17 MED ORDER — HEPARIN (PORCINE) IN NACL 1000-0.9 UT/500ML-% IV SOLN
INTRAVENOUS | Status: DC | PRN
  Administered 2022-10-17 (×2): 500 mL

## 2022-10-17 SURGICAL SUPPLY — 15 items
CARD KEY FFR CATHWORX (MISCELLANEOUS) IMPLANT
CATH INFINITI 5FR ANG PIGTAIL (CATHETERS) IMPLANT
CATH INFINITI AMBI 6FR TG (CATHETERS) IMPLANT
CATH LAUNCHER 6FR EBU3.5 (CATHETERS) IMPLANT
CATH LAUNCHER 6FR JR4 (CATHETERS) IMPLANT
DEVICE RAD COMP TR BAND LRG (VASCULAR PRODUCTS) IMPLANT
FFR CATHWORX KEY CARD (MISCELLANEOUS) ×1
GLIDESHEATH SLEND SS 6F .021 (SHEATH) IMPLANT
GUIDEWIRE PRESSURE X 175 (WIRE) IMPLANT
KIT HEART LEFT (KITS) ×1 IMPLANT
KIT HEMO VALVE WATCHDOG (MISCELLANEOUS) IMPLANT
PACK CARDIAC CATHETERIZATION (CUSTOM PROCEDURE TRAY) ×1 IMPLANT
TRANSDUCER W/STOPCOCK (MISCELLANEOUS) ×1 IMPLANT
TUBING CIL FLEX 10 FLL-RA (TUBING) ×1 IMPLANT
WIRE EMERALD 3MM-J .035X260CM (WIRE) IMPLANT

## 2022-10-17 NOTE — Progress Notes (Signed)
Patient and wife was given discharge instructions. Both verbalized understanding. 

## 2022-10-17 NOTE — Interval H&P Note (Signed)
History and Physical Interval Note:  10/17/2022 1:16 PM  Travis Larson  has presented today for surgery, with the diagnosis of abnormal ct.  The various methods of treatment have been discussed with the patient and family. After consideration of risks, benefits and other options for treatment, the patient has consented to  Procedure(s): LEFT HEART CATH AND CORONARY ANGIOGRAPHY (N/A) as a surgical intervention.  The patient's history has been reviewed, patient examined, no change in status, stable for surgery.  I have reviewed the patient's chart and labs.  Questions were answered to the patient's satisfaction.     Orbie Pyo

## 2022-10-17 NOTE — Discharge Instructions (Signed)

## 2022-10-20 ENCOUNTER — Encounter (HOSPITAL_COMMUNITY): Payer: Self-pay | Admitting: Internal Medicine

## 2022-10-21 ENCOUNTER — Telehealth: Payer: Self-pay | Admitting: Cardiovascular Disease

## 2022-10-21 NOTE — Telephone Encounter (Signed)
Patient states his insurance faxed disability claim paperwork to the office just a few minutes ago. He would like to confirm when it has been received.

## 2022-10-22 DIAGNOSIS — Z0279 Encounter for issue of other medical certificate: Secondary | ICD-10-CM

## 2022-10-22 NOTE — Telephone Encounter (Signed)
Forms and payment were received on __7/24/24___ from patient. Placed in "MD's box."

## 2022-11-11 ENCOUNTER — Ambulatory Visit: Payer: BC Managed Care – PPO | Admitting: Neurology

## 2022-11-13 ENCOUNTER — Encounter: Payer: Self-pay | Admitting: Student

## 2022-11-13 DIAGNOSIS — I2510 Atherosclerotic heart disease of native coronary artery without angina pectoris: Secondary | ICD-10-CM | POA: Insufficient documentation

## 2022-11-13 DIAGNOSIS — R55 Syncope and collapse: Secondary | ICD-10-CM | POA: Insufficient documentation

## 2022-11-13 NOTE — Progress Notes (Signed)
Cardiology Office Note    Date:  11/14/2022  ID:  Travis Larson, DOB 05/31/1960, MRN [id number] Cardiologist: Charlton Haws, MD    History of Present Illness:    Travis Larson is a 62 y.o. male with past medical history of CAD, history of seizures and diverticulitis who presents to the office today for follow-up for his recent cardiac catheterization.  He was examined by Dr. Eden Emms in 09/2022 as a new patient referral for chest pain and also reported a syncopal event in the past. An echocardiogram, Coronary CTA and 30-day monitor were recommended for further evaluation. Echocardiogram showed a preserved EF of 65 to 70% with no regional motion abnormalities and no significant valve abnormalities. Coronary CT did show a calcium score of 191 and total plaque volume of 450 mm and was noted to have severe mixed plaque in the mid-LAD which was positive by FFR. He did meet with Dr. Diona Browner (DOD) on 10/15/2022 to discuss catheterization and this was arranged. He was started on ASA 81 mg daily and continued on Atorvastatin 80 mg daily. His catheterization was performed by Dr. Lynnette Caffey on 10/17/2022 and showed moderate disease with 60% stenosis along the mid-LAD and 60% stenosis along the mid-RCA with borderline significant FFR along the mid-LAD. Given the lack of anginal symptoms, intervention was deferred and medical management was recommended.  In talking with the patient today, he reports overall feeling well since his recent cardiac catheterization. He denies any chest pain or dyspnea on exertion while at work or when doing chores around his home. No recent palpitations, orthopnea, PND or pitting edema.  He reports his prior syncopal episode occurred in the setting of not recently consuming food or liquids and he feels like this contributed at that time. He was unable to wear an event monitor as electronics are not allowed in his workplace as these can interfere with the machines.  Studies Reviewed:   EKG:  EKG is not ordered today.  Coronary CT: 09/2022 IMPRESSION: 1. Coronary calcium score of 191. This was 75th percentile for age-, sex, and race-matched controls.   2. Total plaque volume 450 mm3 which is 54th percentile for age- and sex-matched controls (calcified plaque 30 mm3; non-calcified plaque 420 mm3). 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   3. Normal coronary origin with right dominance.   4. There is severe (>70%) mixed plaque in the mid LAD. CAD-RADS 4. Will send for FFRct.  5.  Aortic atherosclerosis.  1. Left Main: FFRct 0.99   2. LAD: FFRct 0.96 proximal, 0.70 mid, 0.67 distal.   3. LCX: FFRct 0.97 proximal,   4. RCA: FFRct 0.99 proximal, 0.97 mid, 0.89 distal, 0.94 mid   IMPRESSION: 1. FFRct findings are consistent with severe stenosis in the mid LAD.   2.  Recommend cardiac catheterization.   LHC: 09/2022   Mid LAD lesion is 60% stenosed.   Mid RCA lesion is 60% stenosed.   Moderate mid LAD lesion with RFR of 0.89 which is on the borderline of significance.  Give the lack of anginal symptoms, intervention was deferred.   Moderate mid RCA lesion with RFR of 0.96. LVEDP   Recommendation:  Medical therapy.   Physical Exam:   VS:  BP 138/78   Pulse 78   Ht 5\' 10"  (1.778 m)   Wt 173 lb (78.5 kg)   SpO2 94%   BMI 24.82 kg/m    Wt Readings from Last 3 Encounters:  11/14/22 173 lb (78.5 kg)  10/17/22 175 lb (  79.4 kg)  10/15/22 174 lb 9.6 oz (79.2 kg)     GEN: Well nourished, well developed male appearing in no acute distress NECK: No JVD; No carotid bruits CARDIAC: RRR, no murmurs, rubs, gallops RESPIRATORY:  Clear to auscultation without rales, wheezing or rhonchi  ABDOMEN: Appears non-distended. No obvious abdominal masses. EXTREMITIES: No clubbing or cyanosis. No pitting edema.  Distal pedal pulses are 2+ bilaterally. Radial cath site with only mild ecchymosis. No evidence of a hematoma.    Assessment and Plan:   1. CAD - Recent catheterization last  month did show 60% mid-LAD and 60% mid-RCA stenosis as discussed above with medical therapy recommended. Reviewed results with the patient today. At this time, he continues to deny any anginal symptoms. - Continue ASA 81 mg daily and Atorvastatin 80 mg daily. Will provide with an Rx for SL NTG.   2. Syncope - This occurred in the setting of likely dehydration. He denies any repeat syncopal episodes since. He has been unable to wear an event monitor as discussed above given his work conditions.  3. Carotid Artery Stenosis - Carotid dopplers in 07/2022 showed 1 to 49% stenosis along the RICA and LICA. 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 Given no associated symptoms, further intervention has not been pursued. Will plan for repeat carotid dopplers in 1 year.   4. HLD - FLP in 07/2022 showed total cholesterol 163, triglycerides 87, HDL 31 and LDL 116.  He has been started on Atorvastatin 80 mg daily. Will plan for a repeat FLP and LFT's in 2 months.  5. Elevated BP without Diagnosis of HTN - He was previously on Lisinopril but has not taken this in several years. BP was at 138/78 during today's visit and actually elevated above this on recheck. He does have a BP cuff at home and was encouraged to follow this for several weeks and return a BP log. If BP remains above goal, would recommend reinitiation of Lisinopril or Losartan.   Signed, Ellsworth Lennox, PA-C

## 2022-11-14 ENCOUNTER — Ambulatory Visit: Payer: BC Managed Care – PPO | Admitting: Student

## 2022-11-14 ENCOUNTER — Encounter: Payer: Self-pay | Admitting: Student

## 2022-11-14 VITALS — BP 138/78 | HR 78 | Ht 70.0 in | Wt 173.0 lb

## 2022-11-14 DIAGNOSIS — E785 Hyperlipidemia, unspecified: Secondary | ICD-10-CM

## 2022-11-14 DIAGNOSIS — I6523 Occlusion and stenosis of bilateral carotid arteries: Secondary | ICD-10-CM

## 2022-11-14 DIAGNOSIS — R030 Elevated blood-pressure reading, without diagnosis of hypertension: Secondary | ICD-10-CM

## 2022-11-14 DIAGNOSIS — R55 Syncope and collapse: Secondary | ICD-10-CM

## 2022-11-14 DIAGNOSIS — I2510 Atherosclerotic heart disease of native coronary artery without angina pectoris: Secondary | ICD-10-CM | POA: Diagnosis not present

## 2022-11-14 MED ORDER — NITROGLYCERIN 0.4 MG SL SUBL: 0.4000 mg | SUBLINGUAL_TABLET | SUBLINGUAL | 3 refills | Status: AC | PRN

## 2022-11-14 NOTE — Patient Instructions (Addendum)
Medication Instructions:   Can use Nitroglycerin if needed.   *If you need a refill on your cardiac medications before your next appointment, please call your pharmacy*   Lab Work:  Lipid Panel and Liver Function Tests in 2 months.   If you have labs (blood work) drawn today and your tests are completely normal, you will receive your results only by: MyChart Message (if you have MyChart) OR A paper copy in the mail If you have any lab test that is abnormal or we need to change your treatment, we will call you to review the results.    Follow-Up: At [facility], you and your health needs are our priority.  As part of our continuing mission to provide you with exceptional heart care, we have created designated Provider Care Teams.  These Care Teams include your primary Cardiologist (physician) and Advanced Practice Providers (APPs -  Physician Assistants and Nurse Practitioners) who all work together to provide you with the care you need, when you need it.  We recommend signing up for the patient portal called "MyChart".  Sign up information is provided on this After Visit Summary.  MyChart is used to connect with patients for Virtual Visits (Telemedicine).  Patients are able to view lab/test results, encounter notes, upcoming appointments, etc.  Non-urgent messages can be sent to your provider as well.   To learn more about what you can do with MyChart, go to [email or web address].    Your next appointment:   6 month(s)  Provider:   You may see Charlton Haws, MD or one of the following Advanced Practice Providers on your designated Care Team:   Randall An, PA-C  Jacolyn Reedy, [place]     Nitroglycerin Sublingual Tablets What is this medication? NITROGLYCERIN (nye troe GLI ser in) prevents and treats chest pain (angina). It works by relaxing blood vessels, which decreases the amount of work the heart has to do. It belongs to a group of medications called nitrates. This  medicine may be used for other purposes; ask your health care provider or pharmacist if you have questions. COMMON BRAND NAME(S): Nitroquick, Nitrostat, Nitrotab What should I tell my care team before I take this medication? They need to know if you have any of these conditions: Anemia Head injury, recent stroke, or bleeding in the brain Liver disease Previous heart attack An unusual or allergic reaction to nitroglycerin, other medications, foods, dyes, or preservatives Pregnant or trying to get pregnant Breast-feeding How should I use this medication? Take this medication by mouth as needed. Use at the first sign of an angina attack (chest pain or tightness). You can also take this medication 5 to 10 minutes before an event likely to produce chest pain. Follow the directions exactly as written on the prescription label. Place one tablet under your tongue and let it dissolve. Do not swallow whole. Replace the dose if you accidentally swallow it. It will help if your mouth is not dry. Saliva around the tablet will help it to dissolve more quickly. Do not eat or drink, smoke or chew tobacco while a tablet is dissolving. Sit down when taking this medication. In an angina attack, you should feel better within 5 minutes after your first dose. You can take a dose every 5 minutes up to a total of 3 doses. If you do not feel better or feel worse after 1 dose, call 9-1-1 at once. Do not take more than 3 doses in 15 minutes. Your care team might  give you other directions. Follow those directions if they do. Do not take your medication more often than directed. Talk to your care team about the use of this medication in children. Special care may be needed. Overdosage: If you think you have taken too much of this medicine contact a poison control center or emergency room at once. NOTE: This medicine is only for you. Do not share this medicine with others. What if I miss a dose? This does not apply. This  medication is only used as needed. What may interact with this medication? Do not take this medication with any of the following: Certain migraine medications, such as ergotamine and dihydroergotamine (DHE) Medications used to treat erectile dysfunction, such as sildenafil, tadalafil, and vardenafil Riociguat This medication may also interact with the following: Alteplase Aspirin Heparin Medications for blood pressure Medications for depression Other medications used to treat angina Phenothiazines, such as chlorpromazine, mesoridazine, prochlorperazine, thioridazine This list may not describe all possible interactions. Give your health care provider a list of all the medicines, herbs, non-prescription drugs, or dietary supplements you use. Also tell them if you smoke, drink alcohol, or use illegal drugs. Some items may interact with your medicine. What should I watch for while using this medication? Tell your care team if you feel your medication is no longer working. Keep this medication with you at all times. Sit or lie down when you take your medication to prevent falling if you feel dizzy or faint after using it. Try to remain calm. This will help you to feel better faster. If you feel dizzy, take several deep breaths and lie down with your feet propped up, or bend forward with your head resting between your knees. This medication may affect your coordination, reaction time, or judgment. Do not drive or operate machinery until you know how this medication affects you. Sit up or stand slowly to reduce the risk of dizzy or fainting spells. Drinking alcohol with this medication can increase the risk of these side effects. Do not treat yourself for coughs, colds, or pain while you are taking this medication without asking your care team for advice. Some ingredients may increase your blood pressure. What side effects may I notice from receiving this medication? Side effects that you should report  to your care team as soon as possible: Allergic reactions--skin rash, itching, hives, swelling of the face, lips, tongue, or throat Headache, unusual weakness or fatigue, shortness of breath, nausea, vomiting, rapid heartbeat, blue skin or lips, which may be signs of methemoglobinemia Increased pressure around the brain--severe headache, blurry vision, change in vision, nausea, vomiting Low blood pressure--dizziness, feeling faint or lightheaded, blurry vision Slow heartbeat--dizziness, feeling faint or lightheaded, confusion, trouble breathing, unusual weakness or fatigue Worsening chest pain (angina)--pain, pressure, or tightness in the chest, neck, back, or arms Side effects that usually do not require medical attention (report to your care team if they continue or are bothersome): Dizziness Flushing Headache This list may not describe all possible side effects. Call your doctor for medical advice about side effects. You may report side effects to FDA at 1-800-FDA-1088. Where should I keep my medication? Keep out of the reach of children. Store at room temperature between 20 and 25 degrees C (68 and 77 degrees F). Store in Retail buyer. Protect from light and moisture. Keep tightly closed. Throw away any unused medication after the expiration date. NOTE: This sheet is a summary. It may not cover all possible information. If you have questions  about this medicine, talk to your doctor, pharmacist, or health care provider.  2024 Elsevier/Gold Standard (2022-03-17 00:00:00)

## 2022-11-24 NOTE — Telephone Encounter (Signed)
Any eta on when the pw will be complete?

## 2022-11-26 DIAGNOSIS — Z125 Encounter for screening for malignant neoplasm of prostate: Secondary | ICD-10-CM | POA: Diagnosis not present

## 2022-11-26 DIAGNOSIS — Z6825 Body mass index (BMI) 25.0-25.9, adult: Secondary | ICD-10-CM | POA: Diagnosis not present

## 2022-12-16 NOTE — Telephone Encounter (Signed)
Form was completed at [facility] office.

## 2023-02-02 ENCOUNTER — Telehealth: Payer: Self-pay | Admitting: Cardiovascular Disease

## 2023-02-02 NOTE — Telephone Encounter (Signed)
Original form filled out by Randall An, PA on 11/14/2022. Left  a message for patient to call office back regarding forms.

## 2023-02-02 NOTE — Telephone Encounter (Signed)
Received [employer] form today.  I called patient and left message to call me back regarding what we need before completing the form.

## 2023-02-02 NOTE — Telephone Encounter (Signed)
Patient said that [employer] has sent form to be filled out. Pt states he needs the first contact date to be 10/14/22. If you have any questions please call the patient. Thanks

## 2023-02-03 NOTE — Telephone Encounter (Signed)
Patient came in today and signed release of information and paid the forms fee.  [employer] form was given to Dr. Fabio Bering nurse.

## 2023-02-03 NOTE — Telephone Encounter (Signed)
Left message for patient to call back  

## 2023-02-04 ENCOUNTER — Encounter: Payer: Self-pay | Admitting: Student

## 2023-02-04 NOTE — Telephone Encounter (Signed)
[employer] form has already been completed by [place] in [place] just like the one she completed in August.   Per N. [place]way, we are to refund the $29 he paid.  I called and left a message on his phone to come by and get his money.

## 2023-02-04 NOTE — Telephone Encounter (Signed)
Forms were completed by [place]. Faxed to [facility] 02/04/23 Sending to batch to be scanned into chart

## 2023-05-26 NOTE — Progress Notes (Deleted)
 Cardiology Office Note    Date:  05/26/2023  ID:  Travis Larson, DOB 10/14/1960, MRN [id number] Cardiologist: Charlton Haws, MD    History of Present Illness:    Travis Larson is a 63 y.o. male with past medical history of CAD, history of seizures and diverticulitis who presents to the office today for follow-up   Seen by me  09/2022 as a new patient referral for chest pain and also reported a syncopal event in the past. An echocardiogram, Coronary CTA and 30-day monitor were recommended for further evaluation. Echocardiogram showed a preserved EF of 65 to 70% with no regional motion abnormalities and no significant valve abnormalities. Coronary CT did show a calcium score of 191 and total plaque volume of 450 mm and was noted to have severe mixed plaque in the mid-LAD which was positive by FFR. He was started on ASA 81 mg daily and continued on Atorvastatin 80 mg daily.  Cath- Dr. Lynnette Caffey on 10/17/2022 and showed moderate disease with 60% stenosis along the mid-LAD and 60% stenosis along the mid-RCA with borderline significant FFR along the mid-LAD. Given the lack of anginal symptoms, intervention was deferred and medical management was recommended.  ***  Studies Reviewed:   EKG:  10/20/22 SR rate 54 normal   Coronary CT: 09/2022 IMPRESSION: 1. Coronary calcium score of 191. This was 75th percentile for age-, sex, and race-matched controls.   2. Total plaque volume 450 mm3 which is 54th percentile for age- and sex-matched controls (calcified plaque 30 mm3; non-calcified plaque 420 mm3). 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   3. Normal coronary origin with right dominance.   4. There is severe (>70%) mixed plaque in the mid LAD. CAD-RADS 4. Will send for FFRct.  5.  Aortic atherosclerosis.  1. Left Main: FFRct 0.99   2. LAD: FFRct 0.96 proximal, 0.70 mid, 0.67 distal.   3. LCX: FFRct 0.97 proximal,   4. RCA: FFRct 0.99 proximal, 0.97 mid, 0.89 distal, 0.94 mid   IMPRESSION: 1. FFRct findings are  consistent with severe stenosis in the mid LAD.   2.  Recommend cardiac catheterization.   LHC: 09/2022   Mid LAD lesion is 60% stenosed.   Mid RCA lesion is 60% stenosed.   Moderate mid LAD lesion with RFR of 0.89 which is on the borderline of significance.  Give the lack of anginal symptoms, intervention was deferred.   Moderate mid RCA lesion with RFR of 0.96. LVEDP   Recommendation:  Medical therapy.   Physical Exam:   VS:  There were no vitals taken for this visit.   Wt Readings from Last 3 Encounters:  11/14/22 173 lb (78.5 kg)  10/17/22 175 lb (79.4 kg)  10/15/22 174 lb 9.6 oz (79.2 kg)    Affect appropriate Healthy:  appears stated age HEENT: normal Neck supple with no adenopathy JVP normal no bruits no thyromegaly Lungs clear with no wheezing and good diaphragmatic motion Heart:  S1/S2 no murmur, no rub, gallop or click PMI normal Abdomen: benighn, BS positve, no tenderness, no AAA no bruit.  No HSM or HJR Distal pulses intact with no bruits No edema Neuro non-focal Skin warm and dry No muscular weakness    Assessment and Plan:   1. CAD - Catheterization 10/17/22 did show 60% mid-LAD and 60% mid-RCA stenosis as discussed above with medical therapy recommended. Reviewed results with the patient today. At this time, he continues to deny any anginal symptoms. - Continue ASA 81 mg daily and Atorvastatin  80 mg daily. Will provide with an Rx for SL NTG.   2. Syncope - This occurred in the setting of likely dehydration. He denies any repeat syncopal episodes since. He has been unable to wear an event monitor as discussed above given his work conditions.  3. Carotid Artery Stenosis - Carotid dopplers in 07/2022 showed 1 -39% stenosis along the RICA and LICA. 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 Given no associated symptoms, further intervention has not been pursued. Will plan for repeat carotid dopplers May    4. HLD - FLP in 07/2022 showed total cholesterol 163, triglycerides 87, HDL 31 and LDL 116.  He has been started on Atorvastatin 80 mg daily. Repeat lipid and liver   5. Elevated BP without Diagnosis of HTN - He was previously on Lisinopril but has not taken this in several years. BP was at 138/78 during today's visit and actually elevated above this on recheck. He does have a BP cuff at home and was encouraged to follow this for several weeks and return a BP log. If BP remains above goal, would recommend reinitiation of Lisinopril or Losartan.  Lipid/liver Carotid Duplex May 2025  F/U in a year   Signed, Charlton Haws, MD

## 2023-06-04 ENCOUNTER — Ambulatory Visit: Payer: BC Managed Care – PPO | Admitting: Cardiovascular Disease

## 2023-08-17 NOTE — Progress Notes (Signed)
 Cardiology Office Note    Date:  08/31/2023  ID:  Travis Larson, DOB 10/04/1960, MRN [id number] Cardiologist: Janelle Mediate, MD    History of Present Illness:    Travis Larson is a 63 y.o. male with past medical history of CAD, history of seizures and diverticulitis who presents to the office today for follow-up for  CRF and CAD  He was examined by me in 09/2022 as a new patient referral for chest pain and also reported a syncopal event in the past. An echocardiogram, Coronary CTA and 30-day monitor were recommended for further evaluation. Echocardiogram showed a preserved EF of 65 to 70% with no regional motion abnormalities and no significant valve abnormalities. Coronary CT did show a calcium  score of 191 and total plaque volume of 450 mm and was noted to have severe mixed plaque in the mid-LAD which was positive by FFR. He did meet with Dr. Londa Rival (DOD) on 10/15/2022 to discuss catheterization and this was arranged. He was started on ASA 81 mg daily and continued on Atorvastatin  80 mg daily. His catheterization was performed by Dr. Lorie Rook on 10/17/2022 and showed moderate disease with 60% stenosis along the mid-LAD and 60% stenosis along the mid-RCA with borderline significant FFR along the mid-LAD. Given the lack of anginal symptoms, intervention was deferred and medical management was recommended.  Doing well since cath. 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 Still smoking a ppd. No angina Needs lab work for lipids  Studies Reviewed:   EKG: EKG is not ordered today.  Coronary CT: 09/2022 IMPRESSION: 1. Coronary calcium  score of 191. This was 75th percentile for age-, sex, and race-matched controls.   2. Total plaque volume 450 mm3 which is 54th percentile for age- and sex-matched controls (calcified plaque 30 mm3; non-calcified plaque 420 mm3). 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   3. Normal coronary origin with right dominance.   4. There is severe (>70%) mixed plaque in  the mid LAD. CAD-RADS 4. Will send for FFRct.  5.  Aortic atherosclerosis.  1. Left Main: FFRct 0.99   2. LAD: FFRct 0.96 proximal, 0.70 mid, 0.67 distal.   3. LCX: FFRct 0.97 proximal,   4. RCA: FFRct 0.99 proximal, 0.97 mid, 0.89 distal, 0.94 mid   IMPRESSION: 1. FFRct findings are consistent with severe stenosis in the mid LAD.   2.  Recommend cardiac catheterization.   LHC: 09/2022   Mid LAD lesion is 60% stenosed.   Mid RCA lesion is 60% stenosed.   Moderate mid LAD lesion with RFR of 0.89 which is on the borderline of significance.  Give the lack of anginal symptoms, intervention was deferred.   Moderate mid RCA lesion with RFR of 0.96. LVEDP   Recommendation:  Medical therapy.   Physical Exam:   VS:  BP 136/78 (BP Location: Right Arm, Patient Position: Sitting, Cuff Size: Large)   Pulse 72   Ht 5\' 9"  (1.753 m)   Wt 173 lb (78.5 kg)   SpO2 98%   BMI 25.55 kg/m    Wt Readings from Last 3 Encounters:  08/31/23 173 lb (78.5 kg)  11/14/22 173 lb (78.5 kg)  10/17/22 175 lb (79.4 kg)     GEN: Well nourished, well developed male appearing in no acute distress NECK: No JVD; No carotid bruits CARDIAC: RRR, no murmurs, rubs, gallops RESPIRATORY:  Clear to auscultation without rales, wheezing or rhonchi  ABDOMEN: Appears non-distended. No obvious abdominal masses. EXTREMITIES: No clubbing or  cyanosis. No pitting edema.  Distal pedal pulses are 2+ bilaterally. Radial cath site with only mild ecchymosis. No evidence of a hematoma.    Assessment and Plan:   1. CAD - Cath 10/17/22 did show 60% mid-LAD and 60% mid-RCA stenosis as discussed above with medical therapy recommended. FFR in both vessels normal at cath Reviewed results with the patient today. At this time, he continues to deny any anginal symptoms. - Continue ASA 81 mg daily and Atorvastatin  80 mg daily. Will provide with an Rx for SL NTG.   2. Syncope - This occurred in the setting of likely  dehydration. He denies any repeat syncopal episodes since. He has been unable to wear an event monitor as discussed above given his work conditions.  3. Carotid Artery Stenosis - Carotid dopplers in 07/2022 showed 1 to 49% stenosis along the RICA and LICA. 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 Given no associated symptoms, further intervention has not been pursued. Will plan for repeat carotid dopplers this month  4. HLD - FLP in 07/2022 showed total cholesterol 163, triglycerides 87, HDL 31 and LDL 116.  He has been started on Atorvastatin  80 mg daily. Update labs   5. Elevated BP without Diagnosis of HTN -  If BP remains above goal, would recommend reinitiation of Lisinopril or Losartan.  6. Smoking: counseled on cessation < 10 minutes lung cancer CT ordered   Lipid/Liver Carotid duplex Lung cancer CT  F/U in a year    Signed, Janelle Mediate, MD

## 2023-08-31 ENCOUNTER — Ambulatory Visit: Payer: BC Managed Care – PPO | Attending: Cardiovascular Disease | Admitting: Cardiovascular Disease

## 2023-08-31 VITALS — BP 136/78 | HR 72 | Ht 69.0 in | Wt 173.0 lb

## 2023-08-31 DIAGNOSIS — E782 Mixed hyperlipidemia: Secondary | ICD-10-CM | POA: Diagnosis not present

## 2023-08-31 DIAGNOSIS — R55 Syncope and collapse: Secondary | ICD-10-CM | POA: Diagnosis not present

## 2023-08-31 DIAGNOSIS — I6523 Occlusion and stenosis of bilateral carotid arteries: Secondary | ICD-10-CM

## 2023-08-31 DIAGNOSIS — I2510 Atherosclerotic heart disease of native coronary artery without angina pectoris: Secondary | ICD-10-CM

## 2023-08-31 DIAGNOSIS — I10 Essential (primary) hypertension: Secondary | ICD-10-CM | POA: Diagnosis not present

## 2023-08-31 DIAGNOSIS — F17200 Nicotine dependence, unspecified, uncomplicated: Secondary | ICD-10-CM

## 2023-08-31 NOTE — Addendum Note (Signed)
 Addended by: Michaeline Eckersley G on: 08/31/2023 04:15 PM   Modules accepted: Orders

## 2023-08-31 NOTE — Patient Instructions (Signed)
 Medication Instructions:  Your physician recommends that you continue on your current medications as directed. Please refer to the Current Medication list given to you today.  *If you need a refill on your cardiac medications before your next appointment, please call your pharmacy*  Lab Work: Your physician recommends that you return for lab work. Fasting ( lipid, liver)   If you have labs (blood work) drawn today and your tests are completely normal, you will receive your results only by: MyChart Message (if you have MyChart) OR A paper copy in the mail If you have any lab test that is abnormal or we need to change your treatment, we will call you to review the results.  Testing/Procedures: Your physician has requested that you have a carotid duplex. This test is an ultrasound of the carotid arteries in your neck. It looks at blood flow through these arteries that supply the brain with blood. Allow one hour for this exam. There are no restrictions or special instructions.   Non-Cardiac CT scanning, (CAT scanning), is a noninvasive, special x-ray that produces cross-sectional images of the body using x-rays and a computer. CT scans help physicians diagnose and treat medical conditions. For some CT exams, a contrast material is used to enhance visibility in the area of the body being studied. CT scans provide greater clarity and reveal more details than regular x-ray exams.   Follow-Up: At [facility], you and your health needs are our priority.  As part of our continuing mission to provide you with exceptional heart care, our providers are all part of one team.  This team includes your primary Cardiologist (physician) and Advanced Practice Providers or APPs (Physician Assistants and Nurse Practitioners) who all work together to provide you with the care you need, when you need it.  Your next appointment:   1 year(s)  Provider:   You may see Janelle Mediate, MD or one of the following  Advanced Practice Providers on your designated Care Team:   Woodfin Hays, PA-C  [place], [place] Theotis Flake, [place]     We recommend signing up for the patient portal called "MyChart".  Sign up information is provided on this After Visit Summary.  MyChart is used to connect with patients for Virtual Visits (Telemedicine).  Patients are able to view lab/test results, encounter notes, upcoming appointments, etc.  Non-urgent messages can be sent to your provider as well.   To learn more about what you can do with MyChart, go to [email or web address].   Other Instructions Thank you for choosing  [facility]!

## 2023-09-20 DIAGNOSIS — T148XXA Other injury of unspecified body region, initial encounter: Secondary | ICD-10-CM | POA: Diagnosis not present

## 2023-10-07 ENCOUNTER — Other Ambulatory Visit (HOSPITAL_COMMUNITY)

## 2023-10-07 ENCOUNTER — Ambulatory Visit (HOSPITAL_COMMUNITY)

## 2023-10-25 ENCOUNTER — Other Ambulatory Visit: Payer: Self-pay | Admitting: Cardiovascular Disease

## 2023-11-10 ENCOUNTER — Other Ambulatory Visit: Payer: Self-pay | Admitting: Family Medicine

## 2023-11-10 ENCOUNTER — Ambulatory Visit
Admission: RE | Admit: 2023-11-10 | Discharge: 2023-11-10 | Disposition: A | Discharge status: Home / Self Care | Source: Ambulatory Visit | Attending: Family Medicine | Admitting: Family Medicine

## 2023-11-10 DIAGNOSIS — Z021 Encounter for pre-employment examination: Secondary | ICD-10-CM
